# Patient Record
Sex: Male | Born: 1970
Health system: Southern US, Community
[De-identification: ages and names within clinical notes are randomized; demographics above are authoritative.]

## PROBLEM LIST (undated history)

## (undated) DIAGNOSIS — F419 Anxiety disorder, unspecified: Secondary | ICD-10-CM

## (undated) DIAGNOSIS — T7840XA Allergy, unspecified, initial encounter: Secondary | ICD-10-CM

## (undated) DIAGNOSIS — K219 Gastro-esophageal reflux disease without esophagitis: Secondary | ICD-10-CM

## (undated) DIAGNOSIS — R519 Headache, unspecified: Secondary | ICD-10-CM

## (undated) DIAGNOSIS — E119 Type 2 diabetes mellitus without complications: Secondary | ICD-10-CM

## (undated) DIAGNOSIS — E785 Hyperlipidemia, unspecified: Secondary | ICD-10-CM

## (undated) DIAGNOSIS — G4730 Sleep apnea, unspecified: Secondary | ICD-10-CM

## (undated) HISTORY — PX: TRIGGER FINGER RELEASE: SHX641

## (undated) HISTORY — PX: WISDOM TOOTH EXTRACTION: SHX21

## (undated) HISTORY — PX: OTHER SURGICAL HISTORY: SHX169

---

## 2017-10-17 ENCOUNTER — Ambulatory Visit
Admission: EM | Admit: 2017-10-17 | Discharge: 2017-10-17 | Disposition: A | Discharge status: Home / Self Care | Payer: 59 | Attending: Family Medicine | Admitting: Family Medicine

## 2017-10-17 ENCOUNTER — Other Ambulatory Visit: Payer: Self-pay

## 2017-10-17 ENCOUNTER — Encounter: Payer: Self-pay | Admitting: Emergency Medicine

## 2017-10-17 DIAGNOSIS — J0111 Acute recurrent frontal sinusitis: Secondary | ICD-10-CM

## 2017-10-17 HISTORY — DX: Anxiety disorder, unspecified: F41.9

## 2017-10-17 MED ORDER — AMOXICILLIN-POT CLAVULANATE 875-125 MG PO TABS: 1.0000 | ORAL_TABLET | Freq: Two times a day (BID) | ORAL | 0 refills | Status: DC

## 2017-10-17 NOTE — ED Provider Notes (Signed)
[facility]    CSN: [id number] Arrival date & time: 10/17/17  1211     History   Chief Complaint Chief Complaint  Patient presents with  . Facial Pain    pressure, congestion   HPI  46 year old male with a history of anxiety and sinusitis presents with the above complaints.  Patient states that he has been sick for the past week.  He has had severe sinus pressure and pain.  Associated congestion.  Symptoms are moderate to severe.  No fever.  He is taking over-the-counter ibuprofen and sinus remedies without improvement.  No known exacerbating relieving factors.  No other associated symptoms.  No other complaints or concerns at this time.  Past Medical History:  Diagnosis Date  . Anxiety    Past Surgical History:  Procedure Laterality Date  . lipoma removal Left    underarm  . TRIGGER FINGER RELEASE    . WISDOM TOOTH EXTRACTION      Home Medications    Prior to Admission medications   Medication Sig Start Date End Date Taking? Authorizing Provider  Venlafaxine HCl (EFFEXOR PO) Take 1 tablet daily at 3 pm by mouth.   Yes [provider]  amoxicillin-clavulanate (AUGMENTIN) 875-125 MG tablet Take 1 tablet every 12 (twelve) hours by mouth. 10/17/17   Tommie Samsook, Tex Conroy G, DO   Family History Family History  Problem Relation Age of Onset  . Diabetes Mother   . Asthma Mother   . Diabetes Father    Social History Social History   Tobacco Use  . Smoking status: Never Smoker  . Smokeless tobacco: Never Used  Substance Use Topics  . Alcohol use: Yes    Alcohol/week: 1.8 oz    Types: 3 Cans of beer per week  . Drug use: No   Allergies   Patient has no known allergies.  Review of Systems Review of Systems  Constitutional: Negative for fever.  HENT: Positive for congestion, sinus pressure and sinus pain.   All other systems reviewed and are negative.  Physical Exam Triage Vital Signs ED Triage Vitals  Enc Vitals Group     BP 10/17/17 1243 (!)  142/95     Pulse Rate 10/17/17 1243 66     Resp 10/17/17 1243 16     Temp 10/17/17 1243 98.7 F (37.1 C)     Temp Source 10/17/17 1243 Oral     SpO2 10/17/17 1243 99 %     Weight 10/17/17 1242 220 lb (99.8 kg)     Height 10/17/17 1242 5\' 7"  (1.702 m)     Head Circumference --      Peak Flow --      Pain Score 10/17/17 1242 6     Pain Loc --      Pain Edu? --      Excl. in GC? --    Updated Vital Signs BP (!) 142/95 (BP Location: Left Arm)   Pulse 66   Temp 98.7 F (37.1 C) (Oral)   Resp 16   Ht 5\' 7"  (1.702 m)   Wt 220 lb (99.8 kg)   SpO2 99%   BMI 34.46 kg/m  Physical Exam  Constitutional: He is oriented to person, place, and time. He appears well-developed. No distress.  HENT:  Head: Normocephalic and atraumatic.  Nose: Nose normal.  Mouth/Throat: Oropharynx is clear and moist.  Frontal sinus tenderness to palpation/percussion.  Eyes: Conjunctivae are normal. No scleral icterus.  Neck: Neck supple.  Cardiovascular: Normal  rate and regular rhythm.  Pulmonary/Chest: Effort normal and breath sounds normal. No respiratory distress. He has no wheezes. He has no rales.  Lymphadenopathy:    He has no cervical adenopathy.  Neurological: He is alert and oriented to person, place, and time.  Skin: Skin is warm. No rash noted.  Psychiatric: He has a normal mood and affect. His behavior is normal.  Vitals reviewed.  UC Treatments / Results  Labs (all labs ordered are listed, but only abnormal results are displayed) Labs Reviewed - No data to display  EKG  EKG Interpretation None       Radiology No results found.  Procedures Procedures (including critical care time)  Medications Ordered in UC Medications - No data to display   Initial Impression / Assessment and Plan / UC Course  I have reviewed the triage vital signs and the nursing notes.  Pertinent labs & imaging results that were available during my care of the patient were reviewed by me and considered  in my medical decision making (see chart for details).     46 year old male presents with acute sinusitis.  Treating with Augmentin.  Final Clinical Impressions(s) / UC Diagnoses   Final diagnoses:  Acute recurrent frontal sinusitis    ED Discharge Orders        Ordered    amoxicillin-clavulanate (AUGMENTIN) 875-125 MG tablet  Every 12 hours     10/17/17 1314     Controlled Substance Prescriptions [place] Controlled Substance Registry consulted? Not Applicable   Tommie SamsCook, Dijon Kohlman G, DO 10/17/17 1416

## 2017-10-17 NOTE — ED Triage Notes (Signed)
Patient in today c/o sinus congestion, pressure and pain x 1 week. Patient denies fever. Patient has tried OTC Ibuprofen/Advil.

## 2017-10-17 NOTE — Discharge Instructions (Signed)
Antibiotic as prescribed.  Take care  Dr. Myishia Kasik  

## 2018-01-16 ENCOUNTER — Other Ambulatory Visit: Payer: Self-pay

## 2018-01-16 ENCOUNTER — Ambulatory Visit
Admission: EM | Admit: 2018-01-16 | Discharge: 2018-01-16 | Disposition: A | Discharge status: Home / Self Care | Payer: 59 | Attending: Family Medicine | Admitting: Family Medicine

## 2018-01-16 DIAGNOSIS — M25572 Pain in left ankle and joints of left foot: Secondary | ICD-10-CM | POA: Diagnosis not present

## 2018-01-16 MED ORDER — MELOXICAM 15 MG PO TABS: 15.0000 mg | ORAL_TABLET | Freq: Every day | ORAL | 0 refills | Status: DC | PRN

## 2018-01-16 NOTE — ED Provider Notes (Signed)
[facility] ____________________________________________  Time seen: Approximately 1:00 PM  I have reviewed the triage vital signs and the nursing notes.   HISTORY  Chief Complaint Ankle Pain (left)   HPI Joe Richardson is a 47 y.o. male presenting for evaluation of left ankle pain that has been present since this past Saturday.  Patient reports 10 years ago he sustained an inversion injury which he intermittently has pain to left ankle since.  Patient states that he was sitting in his left ankle felt somewhat tight, so he began to roll his ankle in efforts to pop the ankle and relieve tightness.  Patient states that the ankle did pop and states he then had pain to the lateral ankle afterwards and states the pain has continued throughout the week.  Denies any fall or direct trauma.  States pain is primarily with activity.  Improves with rest and over-the-counter ibuprofen.  States while ibuprofen is active he is able to continue activities normally, but once ibuprofen wears off he then begins to limp again.  Denies paresthesias, swelling, skin changes.  Reports has continue to remain active.  Reports otherwise feels well.  Denies other aggravating or alleviating factors. Denies recent sickness. Denies recent antibiotic use.  Denies cardiac history.  Denies renal insufficiency.  Barbette Reichmann, MD: PCP   Past Medical History:  Diagnosis Date  . Anxiety     There are no active problems to display for this patient.   Past Surgical History:  Procedure Laterality Date  . lipoma removal Left    underarm  . TRIGGER FINGER RELEASE    . WISDOM TOOTH EXTRACTION       No current facility-administered medications for this encounter.   Current Outpatient Medications:  .  Venlafaxine HCl (EFFEXOR PO), Take 1 tablet daily at 3 pm by mouth., Disp: , Rfl:  .  meloxicam (MOBIC) 15 MG tablet, Take 1 tablet (15 mg total) by mouth daily as needed., Disp: 10 tablet, Rfl:  0  Allergies Patient has no known allergies.  Family History  Problem Relation Age of Onset  . Diabetes Mother   . Asthma Mother   . Diabetes Father     Social History Social History   Tobacco Use  . Smoking status: Never Smoker  . Smokeless tobacco: Never Used  Substance Use Topics  . Alcohol use: Yes    Alcohol/week: 1.8 oz    Types: 3 Cans of beer per week  . Drug use: No    Review of Systems Constitutional: No fever/chills Cardiovascular: Denies chest pain. Respiratory: Denies shortness of breath. Gastrointestinal: No abdominal pain.  Musculoskeletal: Negative for back pain.  As above Skin: Negative for rash.   ____________________________________________   PHYSICAL EXAM:  VITAL SIGNS: ED Triage Vitals  Enc Vitals Group     BP 01/16/18 1218 130/88     Pulse Rate 01/16/18 1218 85     Resp 01/16/18 1218 18     Temp 01/16/18 1218 98.5 F (36.9 C)     Temp Source 01/16/18 1218 Oral     SpO2 01/16/18 1218 97 %     Weight 01/16/18 1216 215 lb (97.5 kg)     Height 01/16/18 1216 5\' 8"  (1.727 m)     Head Circumference --      Peak Flow --      Pain Score 01/16/18 1216 9     Pain Loc --      Pain Edu? --      Excl.  in GC? --     Constitutional: Alert and oriented. Well appearing and in no acute distress. Cardiovascular: Normal rate, regular rhythm. Grossly normal heart sounds.  Good peripheral circulation. Respiratory: Normal respiratory effort without tachypnea nor retractions. Breath sounds are clear and equal bilaterally. No wheezes, rales, rhonchi. Musculoskeletal: Steady gait. Bilateral pedal pulses equal and easily palpated.      Right lower leg:  No tenderness or edema.      Left lower leg:  No tenderness or edema.  Except: left lateral ankle  Mild tenderness along ATFL, no point bony tenderness, full range of motion, pain with ankle rotation, no pain with resisted plantarflexion or dorsiflexion, steady gait, normal sensation.  Neurologic:  Normal  speech and language. Speech is normal. No gait instability.  Skin:  Skin is warm, dry and intact. No rash noted. Psychiatric: Mood and affect are normal. Speech and behavior are normal. Patient exhibits appropriate insight and judgment   ___________________________________________   LABS (all labs ordered are listed, but only abnormal results are displayed)  Labs Reviewed - No data to display   PROCEDURES Procedures   INITIAL IMPRESSION / ASSESSMENT AND PLAN / ED COURSE  Pertinent labs & imaging results that were available during my care of the patient were reviewed by me and considered in my medical decision making (see chart for details).  Well appearing. No acute distress. Left ankle pain, acute on chronic. No direct trauma. Suspect sprain injury.  Discussed in detail with patient has no direct trauma and no point bony tenderness, discussed use of x-ray, will defer x-ray at this time.  Patient agrees.  Encouraged rest, ice, supportive care, splint given and daily Mobic.  Encouraged orthopedic follow-up if pain continues.Discussed indication, risks and benefits of medications with patient.  Discussed follow up and return parameters including no resolution or any worsening concerns. Patient verbalized understanding and agreed to plan.   ____________________________________________   FINAL CLINICAL IMPRESSION(S) / ED DIAGNOSES  Final diagnoses:  Acute left ankle pain     ED Discharge Orders        Ordered    meloxicam (MOBIC) 15 MG tablet  Daily PRN     01/16/18 1259       Note: This dictation was prepared with Dragon dictation along with smaller phrase technology. Any transcriptional errors that result from this process are unintentional.         Renford DillsMiller, Zeppelin Commisso, NP 01/16/18 1335

## 2018-01-16 NOTE — ED Triage Notes (Signed)
Patient complains of left ankle pain. Patient states that last Saturday his ankle was aching and he moved it around, heard a pop and has been having pain since.

## 2018-01-16 NOTE — Discharge Instructions (Signed)
Take medication as prescribed. Rest. Drink plenty of fluids. Wear splint for support.   Follow up with orthopedic as needed for continued pain.   Follow up with your primary care physician this week as needed. Return to Urgent care for new or worsening concerns.

## 2018-01-19 ENCOUNTER — Telehealth: Payer: Self-pay

## 2018-01-19 NOTE — Telephone Encounter (Signed)
Called to follow up with patient since visit here at [facility]. Patient instructed to call back with any questions or concerns. MAH  

## 2018-04-07 ENCOUNTER — Other Ambulatory Visit: Payer: Self-pay | Admitting: Podiatry

## 2018-04-07 DIAGNOSIS — M25872 Other specified joint disorders, left ankle and foot: Secondary | ICD-10-CM

## 2018-04-14 ENCOUNTER — Ambulatory Visit
Admission: RE | Admit: 2018-04-14 | Discharge: 2018-04-14 | Disposition: A | Discharge status: Home / Self Care | Payer: 59 | Source: Ambulatory Visit | Attending: Podiatry | Admitting: Podiatry

## 2018-04-14 DIAGNOSIS — M25872 Other specified joint disorders, left ankle and foot: Secondary | ICD-10-CM | POA: Insufficient documentation

## 2018-04-14 DIAGNOSIS — R600 Localized edema: Secondary | ICD-10-CM | POA: Insufficient documentation

## 2018-07-24 ENCOUNTER — Ambulatory Visit
Admission: EM | Admit: 2018-07-24 | Discharge: 2018-07-24 | Disposition: A | Discharge status: Home / Self Care | Payer: 59 | Attending: Internal Medicine | Admitting: Internal Medicine

## 2018-07-24 ENCOUNTER — Other Ambulatory Visit: Payer: Self-pay

## 2018-07-24 ENCOUNTER — Ambulatory Visit (INDEPENDENT_AMBULATORY_CARE_PROVIDER_SITE_OTHER): Payer: 59

## 2018-07-24 DIAGNOSIS — K30 Functional dyspepsia: Secondary | ICD-10-CM | POA: Diagnosis not present

## 2018-07-24 DIAGNOSIS — R059 Cough, unspecified: Secondary | ICD-10-CM

## 2018-07-24 DIAGNOSIS — R05 Cough: Secondary | ICD-10-CM

## 2018-07-24 DIAGNOSIS — R1013 Epigastric pain: Secondary | ICD-10-CM

## 2018-07-24 DIAGNOSIS — H6691 Otitis media, unspecified, right ear: Secondary | ICD-10-CM | POA: Diagnosis not present

## 2018-07-24 MED ORDER — IPRATROPIUM-ALBUTEROL 0.5-2.5 (3) MG/3ML IN SOLN
3.0000 mL | Freq: Once | RESPIRATORY_TRACT | Status: AC
  Administered 2018-07-24: 3 mL via RESPIRATORY_TRACT

## 2018-07-24 MED ORDER — AMOXICILLIN-POT CLAVULANATE 875-125 MG PO TABS: 1.0000 | ORAL_TABLET | Freq: Two times a day (BID) | ORAL | 0 refills | Status: DC

## 2018-07-24 MED ORDER — PREDNISONE 50 MG PO TABS: 50.0000 mg | ORAL_TABLET | Freq: Every day | ORAL | 0 refills | Status: DC

## 2018-07-24 MED ORDER — ALBUTEROL SULFATE HFA 108 (90 BASE) MCG/ACT IN AERS: 1.0000 | INHALATION_SPRAY | RESPIRATORY_TRACT | 0 refills | Status: DC | PRN

## 2018-07-24 NOTE — ED Provider Notes (Signed)
[facility]    CSN: [id number] Arrival date & time: 07/24/18  [id number]     History   Chief Complaint Chief Complaint  Patient presents with  . Cough    HPI Joe Richardson is a 47 y.o. male.   He has past history of hyperlipidemia and diabetes.  He presents today with 1 month history of dry cough, this is intermittent during the day.  He does not have any prior history of this symptom.  On a couple occasions when he sneezed, he feels like his throat closed off, this was brief, several seconds, but frightening. No fever, cough is not productive.  Does have some runny nose.   Gives history of environmental allergies, not taking any medicines for this currently. History of wheezing/asthma, has not been on a maintenance medicine but has used an albuterol inhaler intermittently over the last 10 to 15 years.  Has not been hospitalized for respiratory symptoms such as wheezing. Has some difficulty with heartburn, takes 20 mg of Pepcid daily.  HPI  Past Medical History:  Diagnosis Date  . Anxiety     Past Surgical History:  Procedure Laterality Date  . lipoma removal Left    underarm  . TRIGGER FINGER RELEASE    . WISDOM TOOTH EXTRACTION         Home Medications    Prior to Admission medications   Medication Sig Start Date End Date Taking? Authorizing Provider  meloxicam (MOBIC) 15 MG tablet Take 1 tablet (15 mg total) by mouth daily as needed. 01/16/18  Yes Renford DillsMiller, Lindsey, NP  metFORMIN (GLUCOPHAGE-XR) 500 MG 24 hr tablet  05/07/18  Yes [provider]  Venlafaxine HCl (EFFEXOR PO) Take 1 tablet daily at 3 pm by mouth.   Yes [provider]  albuterol (PROVENTIL HFA;VENTOLIN HFA) 108 (90 Base) MCG/ACT inhaler Inhale 1-2 puffs into the lungs every 4 (four) hours as needed for wheezing or shortness of breath. 07/24/18   Isa RankinMurray, Terrance Usery Wilson, MD  amoxicillin-clavulanate (AUGMENTIN) 875-125 MG tablet Take 1 tablet by mouth every 12 (twelve) hours. 07/24/18    Isa RankinMurray, Jadan Rouillard Wilson, MD  atorvastatin (LIPITOR) 40 MG tablet  05/07/18   [provider]  predniSONE (DELTASONE) 50 MG tablet Take 1 tablet (50 mg total) by mouth daily. 07/24/18   Isa RankinMurray, Sylvie Mifsud Wilson, MD    Family History Family History  Problem Relation Age of Onset  . Diabetes Mother   . Asthma Mother   . Diabetes Father     Social History Social History   Tobacco Use  . Smoking status: Never Smoker  . Smokeless tobacco: Never Used  Substance Use Topics  . Alcohol use: Yes    Alcohol/week: 3.0 standard drinks    Types: 3 Cans of beer per week    Comment: occasionally  . Drug use: No     Allergies   Patient has no known allergies.   Review of Systems Review of Systems  All other systems reviewed and are negative.    Physical Exam Triage Vital Signs ED Triage Vitals  Enc Vitals Group     BP 07/24/18 1004 (!) 129/91     Pulse Rate 07/24/18 1004 66     Resp 07/24/18 1004 18     Temp 07/24/18 1004 98.5 F (36.9 C)     Temp Source 07/24/18 1004 Oral     SpO2 07/24/18 1004 95 %     Weight 07/24/18 1003 220 lb (99.8 kg)     Height  07/24/18 1003 5\' 7"  (1.702 m)     Pain Score 07/24/18 1003 0     Pain Loc --    Updated Vital Signs BP (!) 129/91 (BP Location: Left Arm)   Pulse 66   Temp 98.5 F (36.9 C) (Oral)   Resp 18   Ht 5\' 7"  (1.702 m)   Wt 99.8 kg   SpO2 95%   BMI 34.46 kg/m  Physical Exam  Constitutional: He is oriented to person, place, and time. No distress.  Alert, nicely groomed  HENT:  Head: Atraumatic.  Bilateral TMs are dull, right TM is quite red Mild nasal congestion bilaterally Posterior pharynx is difficult to visualize  Eyes:  Conjugate gaze, no eye redness/drainage  Neck: Neck supple.  Cardiovascular: Normal rate and regular rhythm.  Possible S4 appreciated  Pulmonary/Chest: No respiratory distress. He has no wheezes. He has no rales.  Coarse but symmetric breath sounds throughout, good air excursion  Abdominal:  Soft. He exhibits no distension. There is no rebound and no guarding.  Protuberant Mild to moderate epigastric tenderness to deep palpation  Musculoskeletal: Normal range of motion.  1+ and symmetric pitting edema in the bilateral lower extremities just above the ankle, patient states this is chronic and stable  Neurological: He is alert and oriented to person, place, and time.  Skin: Skin is warm and dry.  No cyanosis  Nursing note and vitals reviewed.    UC Treatments / Results   Radiology Dg Chest 2 View  Result Date: 07/24/2018 CLINICAL DATA:  Cough and short of breath EXAM: CHEST - 2 VIEW COMPARISON:  None. FINDINGS: The heart size and mediastinal contours are within normal limits. Both lungs are clear. The visualized skeletal structures are unremarkable. IMPRESSION: No active cardiopulmonary disease. Electronically Signed   By: Marlan Palauharles  Clark M.D.   On: 07/24/2018 10:36    Procedures Procedures (including critical care time)  Medications Ordered in UC Medications  ipratropium-albuterol (DUONEB) 0.5-2.5 (3) MG/3ML nebulizer solution 3 mL (3 mLs Nebulization Given 07/24/18 1057)   Feels less chest tightness after duoneb treatment   Final Clinical Impressions(s) / UC Diagnoses   Final diagnoses:  Cough  Acute right otitis media  Dyspepsia     Discharge Instructions     Chest x-ray today was normal, and did not give a reason for your cough.  Physical exam demonstrated a right ear infection.,  Common possible causes of persistent dry cough include sinus conditions/postnasal drip, asthma/reactive airways disorder, and airway irritation from stomach acid.  Prescription for an antibiotic (amoxicillin/clavulanate), an albuterol inhaler, and some prednisone were sent to the pharmacy.  Please follow-up with your primary care provider if symptoms are not improving in a week or 2, to discuss further work-up that may be helpful.    ED Prescriptions    Medication Sig Dispense  Auth. Provider   albuterol (PROVENTIL HFA;VENTOLIN HFA) 108 (90 Base) MCG/ACT inhaler Inhale 1-2 puffs into the lungs every 4 (four) hours as needed for wheezing or shortness of breath. 1 Inhaler Isa RankinMurray, Lianna Sitzmann Wilson, MD   amoxicillin-clavulanate (AUGMENTIN) 875-125 MG tablet Take 1 tablet by mouth every 12 (twelve) hours. 14 tablet Isa RankinMurray, Rodrickus Min Wilson, MD   predniSONE (DELTASONE) 50 MG tablet Take 1 tablet (50 mg total) by mouth daily. 3 tablet Isa RankinMurray, Shriyan Arakawa Wilson, MD        Isa RankinMurray, Flavio Lindroth Wilson, MD 07/31/18 [phone]

## 2018-07-24 NOTE — Discharge Instructions (Addendum)
Chest x-ray today was normal, and did not give a reason for your cough.  Physical exam demonstrated a right ear infection.,  Common possible causes of persistent dry cough include sinus conditions/postnasal drip, asthma/reactive airways disorder, and airway irritation from stomach acid.  Prescription for an antibiotic (amoxicillin/clavulanate), an albuterol inhaler, and some prednisone were sent to the pharmacy.  Please follow-up with your primary care provider if symptoms are not improving in a week or 2, to discuss further work-up that may be helpful.

## 2018-07-24 NOTE — ED Triage Notes (Signed)
Patient complains of dry cough x 1 month and states that at times after sneezing he has felt almost as if his throat closes off. Patient denies that symptoms currently.

## 2018-08-31 ENCOUNTER — Ambulatory Visit
Admission: EM | Admit: 2018-08-31 | Discharge: 2018-08-31 | Disposition: A | Discharge status: Home / Self Care | Payer: 59 | Attending: Physician Assistant | Admitting: Physician Assistant

## 2018-08-31 ENCOUNTER — Other Ambulatory Visit: Payer: Self-pay

## 2018-08-31 DIAGNOSIS — J019 Acute sinusitis, unspecified: Secondary | ICD-10-CM | POA: Diagnosis not present

## 2018-08-31 DIAGNOSIS — R05 Cough: Secondary | ICD-10-CM | POA: Diagnosis not present

## 2018-08-31 DIAGNOSIS — J309 Allergic rhinitis, unspecified: Secondary | ICD-10-CM | POA: Diagnosis not present

## 2018-08-31 DIAGNOSIS — R059 Cough, unspecified: Secondary | ICD-10-CM

## 2018-08-31 MED ORDER — PREDNISONE 20 MG PO TABS: ORAL_TABLET | ORAL | 0 refills | Status: DC

## 2018-08-31 MED ORDER — DOXYCYCLINE HYCLATE 100 MG PO CAPS: 100.0000 mg | ORAL_CAPSULE | Freq: Two times a day (BID) | ORAL | 0 refills | Status: AC

## 2018-08-31 MED ORDER — BENZONATATE 100 MG PO CAPS: 100.0000 mg | ORAL_CAPSULE | Freq: Three times a day (TID) | ORAL | 0 refills | Status: AC

## 2018-08-31 NOTE — Discharge Instructions (Signed)
SINUSITIS: Your condition today could be related to persistent bacterial sinus infection or allergies. Will cover you for both possibilities. Begin doxycycline and prednisone. Reviewed use of a nasal saline irrigation system. May consider use of intranasal steroids, such as Flonase as well. Use medications as directed. If antibiotics are prescribed, take the full course of antibiotics. Also begin Claritin D. Increase rest, fluids. If you do not improve or if you worsen after a course of antibiotics, you should be re-examined. You may need a different antibiotic or further evaluation with imaging or an exam of the inside of the sinuses   COUGH/ALLERGIES: Your lungs are clear today. The cough is likely related to post nasal drainage. Begin Claritin D and Flonase. Take tessalon perles as needed for cough. Continue using albuterol as needed.  Make a follow up appointment with your PCP in the next 5-10 days

## 2018-08-31 NOTE — ED Provider Notes (Signed)
[facility]    CSN: [id number] Arrival date & time: 08/31/18  1120     History   Chief Complaint Chief Complaint  Patient presents with  . Cough    APPT    HPI Joe Richardson is a 47 y.o. male. Patient presents today for congestion, bilateral ear pain, sinus pain, and cough. He states that he was seen 1 month ago for the same symptoms which went away with Augmentin. He says the symptoms returned about a week ago and have been steadily worsening. Patient does admit to history of mild asthma but denies any wheezing or SOB. He denies fevers, but admits to fatigue. He has tried OTC cough/cold medications recently without improvement in symptoms. He has no other complaints/concerns today.  HPI  Past Medical History:  Diagnosis Date  . Anxiety     There are no active problems to display for this patient.   Past Surgical History:  Procedure Laterality Date  . lipoma removal Left    underarm  . TRIGGER FINGER RELEASE    . WISDOM TOOTH EXTRACTION         Home Medications    Prior to Admission medications   Medication Sig Start Date End Date Taking? Authorizing Provider  albuterol (PROVENTIL HFA;VENTOLIN HFA) 108 (90 Base) MCG/ACT inhaler Inhale 1-2 puffs into the lungs every 4 (four) hours as needed for wheezing or shortness of breath. 07/24/18  Yes Isa Rankin, MD  atorvastatin (LIPITOR) 40 MG tablet  05/07/18  Yes [provider]  meloxicam (MOBIC) 15 MG tablet Take 1 tablet (15 mg total) by mouth daily as needed. 01/16/18  Yes Renford Dills, NP  metFORMIN (GLUCOPHAGE-XR) 500 MG 24 hr tablet  05/07/18  Yes [provider]  Venlafaxine HCl (EFFEXOR PO) Take 1 tablet daily at 3 pm by mouth.   Yes [provider]  amoxicillin-clavulanate (AUGMENTIN) 875-125 MG tablet Take 1 tablet by mouth every 12 (twelve) hours. 07/24/18   Isa Rankin, MD  benzonatate (TESSALON) 100 MG capsule Take 1 capsule (100 mg total) by mouth every 8  (eight) hours for 10 days. 08/31/18 09/10/18  Eusebio Friendly B, PA-C  doxycycline (VIBRAMYCIN) 100 MG capsule Take 1 capsule (100 mg total) by mouth 2 (two) times daily for 10 days. 08/31/18 09/10/18  Eusebio Friendly B, PA-C  predniSONE (DELTASONE) 20 MG tablet Take 2 tabs PO x 5 days 08/31/18   Shirlee Latch, PA-C    Family History Family History  Problem Relation Age of Onset  . Diabetes Mother   . Asthma Mother   . Diabetes Father     Social History Social History   Tobacco Use  . Smoking status: Never Smoker  . Smokeless tobacco: Never Used  Substance Use Topics  . Alcohol use: Yes    Alcohol/week: 3.0 standard drinks    Types: 3 Cans of beer per week    Comment: occasionally  . Drug use: No     Allergies   Patient has no known allergies.   Review of Systems Review of Systems  Constitutional: Positive for fatigue. Negative for chills and fever.  HENT: Positive for congestion, ear pain, postnasal drip, rhinorrhea, sinus pressure and sinus pain. Negative for ear discharge and sore throat.   Eyes: Negative for discharge and redness.  Respiratory: Positive for cough. Negative for chest tightness, shortness of breath and wheezing.   Cardiovascular: Negative for chest pain.  Gastrointestinal: Negative for abdominal pain, nausea and vomiting.  Musculoskeletal: Negative for  arthralgias and myalgias.  Skin: Negative for color change and rash.  Allergic/Immunologic: Positive for environmental allergies.  Neurological: Positive for headaches. Negative for dizziness and weakness.  Hematological: Negative for adenopathy.     Physical Exam Triage Vital Signs ED Triage Vitals  Enc Vitals Group     BP 08/31/18 1142 (!) 133/92     Pulse Rate 08/31/18 1142 77     Resp 08/31/18 1142 18     Temp 08/31/18 1142 98.5 F (36.9 C)     Temp Source 08/31/18 1142 Oral     SpO2 08/31/18 1142 98 %     Weight 08/31/18 1140 220 lb (99.8 kg)     Height 08/31/18 1140 5\' 7"  (1.702 m)     Head  Circumference --      Peak Flow --      Pain Score 08/31/18 1140 4     Pain Loc --      Pain Edu? --      Excl. in GC? --    No data found.  Updated Vital Signs BP (!) 133/92 (BP Location: Left Arm)   Pulse 77   Temp 98.5 F (36.9 C) (Oral)   Resp 18   Ht 5\' 7"  (1.702 m)   Wt 220 lb (99.8 kg)   SpO2 98%   BMI 34.46 kg/m   Visual Acuity Right Eye Distance:   Left Eye Distance:   Bilateral Distance:    Right Eye Near:   Left Eye Near:    Bilateral Near:     Physical Exam  Constitutional: He is oriented to person, place, and time. He appears well-developed and well-nourished. No distress.  HENT:  Head: Normocephalic and atraumatic.  Right Ear: Tympanic membrane, external ear and ear canal normal.  Left Ear: Tympanic membrane, external ear and ear canal normal.  Nose: Rhinorrhea (moderate purlent drainage bilat nares) present. No septal deviation. Right sinus exhibits maxillary sinus tenderness. Right sinus exhibits no frontal sinus tenderness. Left sinus exhibits maxillary sinus tenderness. Left sinus exhibits no frontal sinus tenderness.  Mouth/Throat: Oropharynx is clear and moist. No oropharyngeal exudate.  Eyes: Pupils are equal, round, and reactive to light. Conjunctivae and EOM are normal. Right eye exhibits no discharge. Left eye exhibits no discharge. No scleral icterus.  Cardiovascular: Normal rate and regular rhythm.  Murmur heard. Pulmonary/Chest: Effort normal and breath sounds normal. He has no wheezes.  Lymphadenopathy:    He has no cervical adenopathy.  Neurological: He is alert and oriented to person, place, and time.  Skin: Skin is warm and dry. No rash noted.  Psychiatric: He has a normal mood and affect. His behavior is normal.  Nursing note and vitals reviewed.    UC Treatments / Results  Labs (all labs ordered are listed, but only abnormal results are displayed) Labs Reviewed - No data to display  EKG None  Radiology No results  found.  Procedures Procedures (including critical care time)  Medications Ordered in UC Medications - No data to display  Initial Impression / Assessment and Plan / UC Course  I have reviewed the triage vital signs and the nursing notes.  Pertinent labs & imaging results that were available during my care of the patient were reviewed by me and considered in my medical decision making (see chart for details).    Final Clinical Impressions(s) / UC Diagnoses   Final diagnoses:  Acute sinusitis treated with antibiotics in the past 60 days  Allergic rhinitis, unspecified seasonality, unspecified trigger  Cough     Discharge Instructions     SINUSITIS: Your condition today could be related to persistent bacterial sinus infection or allergies. Will cover you for both possibilities. Begin doxycycline and prednisone. Reviewed use of a nasal saline irrigation system. May consider use of intranasal steroids, such as Flonase as well. Use medications as directed. If antibiotics are prescribed, take the full course of antibiotics. Also begin Claritin D. Increase rest, fluids. If you do not improve or if you worsen after a course of antibiotics, you should be re-examined. You may need a different antibiotic or further evaluation with imaging or an exam of the inside of the sinuses   COUGH/ALLERGIES: Your lungs are clear today. The cough is likely related to post nasal drainage. Begin Claritin D and Flonase. Take tessalon perles as needed for cough. Continue using albuterol as needed.  Make a follow up appointment with your PCP in the next 5-10 days    ED Prescriptions    Medication Sig Dispense Auth. Provider   doxycycline (VIBRAMYCIN) 100 MG capsule Take 1 capsule (100 mg total) by mouth 2 (two) times daily for 10 days. 20 capsule Eusebio Friendly B, PA-C   predniSONE (DELTASONE) 20 MG tablet Take 2 tabs PO x 5 days 10 tablet Eusebio Friendly B, PA-C   benzonatate (TESSALON) 100 MG capsule Take 1  capsule (100 mg total) by mouth every 8 (eight) hours for 10 days. 30 capsule Shirlee Latch, PA-C     Controlled Substance Prescriptions [place] Controlled Substance Registry consulted? Not Applicable   Gareth Morgan 09/02/18 Rickey Primus

## 2018-08-31 NOTE — ED Triage Notes (Signed)
Patient complains of cough and congestion, ear pain. Patient states that he was seen for this on 08/16. Treated with Augmentin, prednisone and albuterol. Reports that he felt better for a few days but has been worsening recently.

## 2019-01-25 ENCOUNTER — Encounter: Payer: Self-pay | Admitting: Emergency Medicine

## 2019-01-25 ENCOUNTER — Other Ambulatory Visit: Payer: Self-pay

## 2019-01-25 ENCOUNTER — Ambulatory Visit
Admission: EM | Admit: 2019-01-25 | Discharge: 2019-01-25 | Disposition: A | Discharge status: Home / Self Care | Payer: 59 | Attending: Family Medicine | Admitting: Family Medicine

## 2019-01-25 DIAGNOSIS — J111 Influenza due to unidentified influenza virus with other respiratory manifestations: Secondary | ICD-10-CM

## 2019-01-25 DIAGNOSIS — R509 Fever, unspecified: Secondary | ICD-10-CM

## 2019-01-25 DIAGNOSIS — R69 Illness, unspecified: Principal | ICD-10-CM

## 2019-01-25 DIAGNOSIS — R51 Headache: Secondary | ICD-10-CM

## 2019-01-25 DIAGNOSIS — R52 Pain, unspecified: Secondary | ICD-10-CM | POA: Diagnosis not present

## 2019-01-25 HISTORY — DX: Hyperlipidemia, unspecified: E78.5

## 2019-01-25 HISTORY — DX: Type 2 diabetes mellitus without complications: E11.9

## 2019-01-25 MED ORDER — OSELTAMIVIR PHOSPHATE 75 MG PO CAPS: 75.0000 mg | ORAL_CAPSULE | Freq: Two times a day (BID) | ORAL | 0 refills | Status: DC

## 2019-01-25 NOTE — ED Triage Notes (Signed)
Patient in today c/o body aches, headache and fever (100.4) x 2 days. Patient has tried OTC Ibuprofen. Last dose at ~4am this morning.

## 2019-01-25 NOTE — ED Provider Notes (Addendum)
[facility] ____________________________________________  Time seen: Approximately 12:27 PM  I have reviewed the triage vital signs and the nursing notes.   HISTORY  Chief Complaint Generalized Body Aches; Headache; and Fever  HPI Joe Richardson is a 48 y.o. male presenting for evaluation of 2 days of chills, body aches, fevers.  States has had some sinus congestion and sinus pressure.  Denies sore throat.  Has continued to eat and drink overall well. Decreased appetite. Diffuse generalized body aches. Patient states his son was recently sick with similar complaints just prior to his sickness onset.  States his temperature was reading over 100 when he was feeling more achy.  States he does not feel like he had a fever now, but does still have some generalized body aches and feels tired.  Denies cough.  Denies chest pain or shortness of breath.  Denies abdominal pain, rash, dysuria, sore throat, recent sickness or other complaints.  Reports otherwise feels well.  Has taken some over-the-counter Tylenol and ibuprofen, last took ibuprofen in the middle the night last night.  Denies other relieving factors.  Barbette Reichmann, MD: PCP   Past Medical History:  Diagnosis Date  . Anxiety   . Diabetes mellitus without complication (HCC)    pre-diabetes  . Hyperlipidemia     There are no active problems to display for this patient.   Past Surgical History:  Procedure Laterality Date  . lipoma removal Left    underarm  . TRIGGER FINGER RELEASE    . WISDOM TOOTH EXTRACTION       No current facility-administered medications for this encounter.   Current Outpatient Medications:  .  albuterol (PROVENTIL HFA;VENTOLIN HFA) 108 (90 Base) MCG/ACT inhaler, Inhale 1-2 puffs into the lungs every 4 (four) hours as needed for wheezing or shortness of breath., Disp: 1 Inhaler, Rfl: 0 .  atorvastatin (LIPITOR) 40 MG tablet, , Disp: , Rfl:  .  metFORMIN (GLUCOPHAGE-XR) 500 MG 24 hr tablet,  , Disp: , Rfl:  .  venlafaxine XR (EFFEXOR-XR) 150 MG 24 hr capsule, Take 1 capsule by mouth daily., Disp: , Rfl:  .  oseltamivir (TAMIFLU) 75 MG capsule, Take 1 capsule (75 mg total) by mouth every 12 (twelve) hours., Disp: 10 capsule, Rfl: 0  Allergies Patient has no known allergies.  Family History  Problem Relation Age of Onset  . Diabetes Mother   . Asthma Mother   . Diabetes Father     Social History Social History   Tobacco Use  . Smoking status: Never Smoker  . Smokeless tobacco: Never Used  Substance Use Topics  . Alcohol use: Yes    Alcohol/week: 3.0 standard drinks    Types: 3 Cans of beer per week    Comment: occasionally  . Drug use: No    Review of Systems Constitutional: Positive fever.  Eyes: No visual changes. ENT: No sore throat. Cardiovascular: Denies chest pain. Respiratory: Denies shortness of breath. Gastrointestinal: No abdominal pain.  No nausea, no vomiting.  No diarrhea.   Genitourinary: Negative for dysuria.  Musculoskeletal: Negative for back pain. Skin: Negative for rash.  ____________________________________________   PHYSICAL EXAM:  VITAL SIGNS: ED Triage Vitals  Enc Vitals Group     BP 01/25/19 1122 (!) 125/91     Pulse Rate 01/25/19 1122 85     Resp 01/25/19 1122 16     Temp 01/25/19 1122 98.5 F (36.9 C)     Temp Source 01/25/19 1122 Oral     SpO2  01/25/19 1122 98 %     Weight 01/25/19 1123 210 lb (95.3 kg)     Height 01/25/19 1123 5\' 7"  (1.702 m)     Head Circumference --      Peak Flow --      Pain Score 01/25/19 1122 4     Pain Loc --      Pain Edu? --      Excl. in GC? --     Constitutional: Alert and oriented. Well appearing and in no acute distress. Eyes: Conjunctivae are normal. Head: Atraumatic.Mild tenderness to palpation bilateral frontal and maxillary sinuses. No swelling. No erythema.   Ears: no erythema, normal TMs bilaterally.   Nose: no nasal congestion   Mouth/Throat: Mucous membranes are moist.  Oropharynx non-erythematous. No tonsillar swelling or exudate.  Neck: No stridor.  No cervical spine tenderness to palpation. Hematological/Lymphatic/Immunilogical: No cervical lymphadenopathy. Cardiovascular: Normal rate, regular rhythm. Grossly normal heart sounds.  Good peripheral circulation. Respiratory: Normal respiratory effort.  No retractions. No wheezes, rales or rhonchi. Good air movement.  Musculoskeletal: No cervical, thoracic or lumbar tenderness to palpation.  Neurologic:  Normal speech and language. No gross focal neurologic deficits are appreciated. No gait instability. No meningismus.  Skin:  Skin is warm, dry and intact. No rash noted. Psychiatric: Mood and affect are normal. Speech and behavior are normal.  ___________________________________________   LABS (all labs ordered are listed, but only abnormal results are displayed)  Labs Reviewed - No data to display ____________________________________________  PROCEDURES Procedures    INITIAL IMPRESSION / ASSESSMENT AND PLAN / ED COURSE  Pertinent labs & imaging results that were available during my care of the patient were reviewed by me and considered in my medical decision making (see chart for details).  Well appearing patient. No acute distress. In absence of other symptoms, suspect influenza like illness. Discussed treatment options, will treat with oral tamiflu. Encourage rest, fluids, otc tylenol and ibuprofen, supportive care. Work note given. Discussed indication, risks and benefits of medications with patient.  Discussed follow up with Primary care physician this week. Discussed follow up and return parameters including no resolution or any worsening concerns. Patient verbalized understanding and agreed to plan.   ____________________________________________   FINAL CLINICAL IMPRESSION(S) / ED DIAGNOSES  Final diagnoses:  Influenza-like illness     ED Discharge Orders         Ordered     oseltamivir (TAMIFLU) 75 MG capsule  Every 12 hours     01/25/19 1221           Note: This dictation was prepared with Dragon dictation along with smaller phrase technology. Any transcriptional errors that result from this process are unintentional.         Renford Dills, NP 01/25/19 1232

## 2019-01-25 NOTE — Discharge Instructions (Signed)
Take medication as prescribed. Rest. Drink plenty of fluids.  ° °Follow up with your primary care physician this week as needed. Return to Urgent care for new or worsening concerns.  ° °

## 2019-05-27 ENCOUNTER — Ambulatory Visit
Admission: EM | Admit: 2019-05-27 | Discharge: 2019-05-27 | Disposition: A | Discharge status: Home / Self Care | Payer: 59 | Attending: Family Medicine | Admitting: Family Medicine

## 2019-05-27 ENCOUNTER — Other Ambulatory Visit: Payer: Self-pay

## 2019-05-27 ENCOUNTER — Encounter: Payer: Self-pay | Admitting: Emergency Medicine

## 2019-05-27 DIAGNOSIS — J0191 Acute recurrent sinusitis, unspecified: Secondary | ICD-10-CM

## 2019-05-27 MED ORDER — AMOXICILLIN-POT CLAVULANATE 875-125 MG PO TABS: 1.0000 | ORAL_TABLET | Freq: Two times a day (BID) | ORAL | 0 refills | Status: DC

## 2019-05-27 MED ORDER — PREDNISONE 50 MG PO TABS: ORAL_TABLET | ORAL | 0 refills | Status: DC

## 2019-05-27 NOTE — ED Triage Notes (Signed)
Pt c/o sinus pain, pressure, headache, and sinus congestion. Started about a week ago. Denies fever.

## 2019-05-27 NOTE — ED Provider Notes (Signed)
[facility]    CSN: [id number] Arrival date & time: 05/27/19  4742   History   Chief Complaint Chief Complaint  Patient presents with  . Sinus Problem    APPT   HPI  48 year old male presents the above complaint.  Patient reports a one-week history of sinus pain, pressure, congestion.  He states that his symptoms are severe.  He has had no relief with over-the-counter ibuprofen, Claritin, Sudafed.  Has a history of sinusitis.  Pain is currently 4/10 in severity.  No reports of purulent nasal discharge.  He does report associated ear discomfort.  No known exacerbating factors.  No other reported symptoms.  No other complaints or concerns at this time.  PMH, Surgical Hx, Family Hx, Social History reviewed and updated as below.  Past Medical History:  Diagnosis Date  . Anxiety   . Diabetes mellitus without complication (HCC)    pre-diabetes  . Hyperlipidemia   Hx of sinusitis Obesity  Past Surgical History:  Procedure Laterality Date  . lipoma removal Left    underarm  . TRIGGER FINGER RELEASE    . WISDOM TOOTH EXTRACTION       Home Medications    Prior to Admission medications   Medication Sig Start Date End Date Taking? Authorizing Provider  albuterol (PROVENTIL HFA;VENTOLIN HFA) 108 (90 Base) MCG/ACT inhaler Inhale 1-2 puffs into the lungs every 4 (four) hours as needed for wheezing or shortness of breath. 07/24/18  Yes Wynona Luna, MD  metFORMIN (GLUCOPHAGE-XR) 500 MG 24 hr tablet  05/07/18  Yes [provider]  venlafaxine XR (EFFEXOR-XR) 150 MG 24 hr capsule Take 1 capsule by mouth daily. 03/17/17  Yes [provider]  amoxicillin-clavulanate (AUGMENTIN) 875-125 MG tablet Take 1 tablet by mouth every 12 (twelve) hours. 05/27/19   Coral Spikes, DO  predniSONE (DELTASONE) 50 MG tablet 1 tablet daily x 5 days 05/27/19   Coral Spikes, DO  atorvastatin (LIPITOR) 40 MG tablet  05/07/18 05/27/19  [provider]    Family  History Family History  Problem Relation Age of Onset  . Diabetes Mother   . Asthma Mother   . Diabetes Father     Social History Social History   Tobacco Use  . Smoking status: Never Smoker  . Smokeless tobacco: Never Used  Substance Use Topics  . Alcohol use: Yes    Alcohol/week: 3.0 standard drinks    Types: 3 Cans of beer per week    Comment: occasionally  . Drug use: No     Allergies   Patient has no known allergies.   Review of Systems Review of Systems  Constitutional: Negative for fever.  HENT: Positive for congestion, ear pain, sinus pressure and sinus pain.    Physical Exam Triage Vital Signs ED Triage Vitals  Enc Vitals Group     BP 05/27/19 0933 (!) 151/100     Pulse Rate 05/27/19 0933 62     Resp 05/27/19 0933 18     Temp 05/27/19 0933 98 F (36.7 C)     Temp Source 05/27/19 0933 Oral     SpO2 05/27/19 0933 97 %     Weight 05/27/19 0930 220 lb (99.8 kg)     Height 05/27/19 0930 5\' 7"  (1.702 m)     Head Circumference --      Peak Flow --      Pain Score 05/27/19 0930 4     Pain Loc --  Pain Edu? --      Excl. in GC? --    Updated Vital Signs BP (!) 151/100 (BP Location: Right Arm)   Pulse 62   Temp 98 F (36.7 C) (Oral)   Resp 18   Ht 5\' 7"  (1.702 m)   Wt 99.8 kg   SpO2 97%   BMI 34.46 kg/m   Visual Acuity Right Eye Distance:   Left Eye Distance:   Bilateral Distance:    Right Eye Near:   Left Eye Near:    Bilateral Near:     Physical Exam Vitals signs and nursing note reviewed.  Constitutional:      General: He is not in acute distress.    Appearance: He is obese. He is not ill-appearing.  HENT:     Head: Normocephalic and atraumatic.     Right Ear: Tympanic membrane normal.     Left Ear: Tympanic membrane normal.     Nose: Congestion present.     Comments: Maxillary and frontal sinus tenderness to palpation. Eyes:     General:        Right eye: No discharge.        Left eye: No discharge.     Conjunctiva/sclera:  Conjunctivae normal.  Cardiovascular:     Rate and Rhythm: Normal rate and regular rhythm.  Pulmonary:     Effort: Pulmonary effort is normal.     Breath sounds: Normal breath sounds.  Neurological:     Mental Status: He is alert.  Psychiatric:        Mood and Affect: Mood normal.        Behavior: Behavior normal.    UC Treatments / Results  Labs (all labs ordered are listed, but only abnormal results are displayed) Labs Reviewed - No data to display  EKG None  Radiology No results found.  Procedures Procedures (including critical care time)  Medications Ordered in UC Medications - No data to display  Initial Impression / Assessment and Plan / UC Course  I have reviewed the triage vital signs and the nursing notes.  Pertinent labs & imaging results that were available during my care of the patient were reviewed by me and considered in my medical decision making (see chart for details).    48 year old male presents with sinusitis.  Treating with Augmentin and prednisone.  Final Clinical Impressions(s) / UC Diagnoses   Final diagnoses:  Acute recurrent sinusitis, unspecified location   Discharge Instructions   None    ED Prescriptions    Medication Sig Dispense Auth. Provider   amoxicillin-clavulanate (AUGMENTIN) 875-125 MG tablet Take 1 tablet by mouth every 12 (twelve) hours. 14 tablet Hadessah Grennan G, DO   predniSONE (DELTASONE) 50 MG tablet 1 tablet daily x 5 days 5 tablet Tommie Samsook, Elam Ellis G, DO     Controlled Substance Prescriptions [place] Controlled Substance Registry consulted? Not Applicable   Tommie SamsCook, Latravia Southgate G, DO 05/27/19 1031

## 2019-06-03 ENCOUNTER — Other Ambulatory Visit: Payer: Self-pay | Admitting: Emergency Medicine

## 2019-06-03 MED ORDER — AMOXICILLIN-POT CLAVULANATE ER 1000-62.5 MG PO TB12: 2.0000 | ORAL_TABLET | Freq: Two times a day (BID) | ORAL | 0 refills | Status: DC

## 2019-06-03 NOTE — Telephone Encounter (Signed)
Patient called reporting he finished his Prednisone and Amoxicillin for his sinus infection. He states he is still having pain and pressure between his eyes and is requesting another antibiotic. Spoke with Honor Loh, NP and a new script for Augmentin 2000/125mg  BID x 10 days to [facility] in [place]. Patient contacted via phone and made aware.

## 2020-01-11 ENCOUNTER — Other Ambulatory Visit: Payer: Self-pay

## 2020-01-11 ENCOUNTER — Ambulatory Visit
Admission: EM | Admit: 2020-01-11 | Discharge: 2020-01-11 | Disposition: A | Discharge status: Home / Self Care | Payer: 59 | Attending: Emergency Medicine | Admitting: Emergency Medicine

## 2020-01-11 ENCOUNTER — Encounter: Payer: Self-pay | Admitting: Emergency Medicine

## 2020-01-11 ENCOUNTER — Ambulatory Visit (INDEPENDENT_AMBULATORY_CARE_PROVIDER_SITE_OTHER): Payer: 59

## 2020-01-11 DIAGNOSIS — R0602 Shortness of breath: Secondary | ICD-10-CM | POA: Diagnosis not present

## 2020-01-11 DIAGNOSIS — R05 Cough: Secondary | ICD-10-CM | POA: Diagnosis not present

## 2020-01-11 DIAGNOSIS — U071 COVID-19: Secondary | ICD-10-CM | POA: Diagnosis not present

## 2020-01-11 DIAGNOSIS — R062 Wheezing: Secondary | ICD-10-CM | POA: Diagnosis not present

## 2020-01-11 MED ORDER — PREDNISONE 50 MG PO TABS: 50.0000 mg | ORAL_TABLET | Freq: Every day | ORAL | 0 refills | Status: DC

## 2020-01-11 NOTE — ED Provider Notes (Signed)
[facility] - [place], [place]   Name: Joe Richardson DOB: Feb 03, 1971 MRN: [id number] CSN: [id number] PCP: Barbette Reichmann, MD  Arrival date and time:  01/11/20 0831  Chief Complaint:  Cough   NOTE: Prior to seeing the patient today, I have reviewed the triage nursing documentation and vital signs. Clinical staff has updated patient's PMH/PSHx, current medication list, and drug allergies/intolerances to ensure comprehensive history available to assist in medical decision making.   History:   HPI: Joe Richardson is a 49 y.o. male who presents today with complaints of worsening cough in the setting of a positive COVID-19 diagnosis.  Patient started feeling Covid symptoms approximately 10 days ago, and was tested on Tuesday, January 26.  He received a positive result at that time and has since quarantined at home.  Patient states his symptoms started to resolve on Thursday, but Sunday night he started feeling worse.  He noticed his cough and chills have returned and he noticed increased shortness of breath.  He attempted to treat his symptoms with the previous prescribed albuterol inhaler and over-the-counter Mucinex.  Minimal results are noted with the aforementioned treatment, therefore he is here seeking further evaluation.  He denies any recent fevers, no recent antibiotic use.  Pertinent medical history includes mild asthma.   Past Medical History:  Diagnosis Date  . Anxiety   . Diabetes mellitus without complication (HCC)    pre-diabetes  . Hyperlipidemia     Past Surgical History:  Procedure Laterality Date  . lipoma removal Left    underarm  . TRIGGER FINGER RELEASE    . WISDOM TOOTH EXTRACTION      Family History  Problem Relation Age of Onset  . Diabetes Mother   . Asthma Mother   . Diabetes Father     Social History   Tobacco Use  . Smoking status: Never Smoker  . Smokeless tobacco: Never Used  Substance Use Topics  . Alcohol use: Yes    Alcohol/week: 3.0  standard drinks    Types: 3 Cans of beer per week    Comment: occasionally  . Drug use: No    There are no problems to display for this patient.   Home Medications:    Current Meds  Medication Sig  . albuterol (PROVENTIL HFA;VENTOLIN HFA) 108 (90 Base) MCG/ACT inhaler Inhale 1-2 puffs into the lungs every 4 (four) hours as needed for wheezing or shortness of breath.  . metFORMIN (GLUCOPHAGE-XR) 500 MG 24 hr tablet   . montelukast (SINGULAIR) 10 MG tablet Take 10 mg by mouth at bedtime.  Marland Kitchen venlafaxine XR (EFFEXOR-XR) 150 MG 24 hr capsule Take 1 capsule by mouth daily.    Allergies:   Patient has no known allergies.  Review of Systems (ROS): Review of Systems  Constitutional: Negative for activity change, appetite change, chills, diaphoresis and fatigue.  HENT: Negative for congestion, postnasal drip, sinus pain, sneezing and sore throat.   Respiratory: Positive for cough, shortness of breath and wheezing.   Cardiovascular: Negative for chest pain.  Gastrointestinal: Negative for diarrhea, nausea and vomiting.  All other systems reviewed and are negative.    Vital Signs: Today's Vitals   01/11/20 0854 01/11/20 0855 01/11/20 0857  BP:   (!) 135/102  Pulse:   92  Resp:   18  Temp:   98.8 F (37.1 C)  TempSrc:   Oral  SpO2:   95%  Weight:  210 lb (95.3 kg)   Height:  5\' 7"  (1.702  m)   PainSc: 3       Physical Exam: Physical Exam Constitutional:      Appearance: Normal appearance.  HENT:     Right Ear: Tympanic membrane normal.     Left Ear: Tympanic membrane normal.     Nose: Nose normal.     Mouth/Throat:     Mouth: Mucous membranes are moist.     Pharynx: No posterior oropharyngeal erythema.  Cardiovascular:     Rate and Rhythm: Normal rate and regular rhythm.     Pulses: Normal pulses.     Heart sounds: Normal heart sounds.  Pulmonary:     Effort: Pulmonary effort is normal.     Breath sounds: Normal breath sounds.  Lymphadenopathy:     Cervical: No  cervical adenopathy.  Skin:    General: Skin is warm and dry.  Neurological:     Mental Status: He is alert.  Psychiatric:        Mood and Affect: Mood normal.        Behavior: Behavior normal.      Urgent Care Treatments / Results:   LABS: PLEASE NOTE: all labs that were ordered this encounter are listed, however only abnormal results are displayed. Labs Reviewed - No data to display  EKG: -None  RADIOLOGY: DG Chest 2 View  Result Date: 01/11/2020 CLINICAL DATA:  Cough, COVID positive EXAM: CHEST - 2 VIEW COMPARISON:  None. FINDINGS: Lungs are clear.  No pleural effusion or pneumothorax. The heart is normal in size. Visualized osseous structures are within normal limits. IMPRESSION: Normal chest radiographs. Electronically Signed   By: Charline Bills M.D.   On: 01/11/2020 09:52    PROCEDURES: Procedures  MEDICATIONS RECEIVED THIS VISIT: Medications - No data to display  PERTINENT CLINICAL COURSE NOTES/UPDATES:   Initial Impression / Assessment and Plan / Urgent Care Course:  Pertinent labs & imaging results that were available during my care of the patient were personally reviewed by me and considered in my medical decision making (see lab/imaging section of note for values and interpretations).  Joe Richardson is a 49 y.o. male who presents to [facility] today with complaints of cough, diagnosed with cough in the setting of COVID-19, and treated as such with the medications below. NP and patient reviewed discharge instructions below during visit.   Patient is well appearing overall in clinic today. He does not appear to be in any acute distress. Presenting symptoms (see HPI) and exam as documented above.   I have reviewed the follow up and strict return precautions for any new or worsening symptoms. Patient is aware of symptoms that would be deemed urgent/emergent, and would thus require further evaluation either here or in the emergency department. At the time of  discharge, he verbalized understanding and consent with the discharge plan as it was reviewed with him. All questions were fielded by provider and/or clinic staff prior to patient discharge.    Final Clinical Impressions / Urgent Care Diagnoses:   Final diagnoses:  COVID-19    New Prescriptions:  [place] Controlled Substance Registry consulted? Not Applicable  Meds ordered this encounter  Medications  . predniSONE (DELTASONE) 50 MG tablet    Sig: Take 1 tablet (50 mg total) by mouth daily with breakfast.    Dispense:  5 tablet    Refill:  0      Discharge Instructions     You are seen for worsening cough for COVID-19 diagnosis.  No pneumonia was seen on  your chest x-ray so we will continue to treat symptomatically.  Take your steroids as prescribed.  Use albuterol inhaler every 4 hours for 2 days, and then every 4-6 hours as needed for shortness of breath.  If your shortness of breath or cough starts to worsen, seek further care.  I recommend that you stay quarantined until February 6.    Recommended Follow up Care:  Patient encouraged to follow up with the following provider within the specified time frame, or sooner as dictated by the severity of his symptoms. As always, he was instructed that for any urgent/emergent care needs, he should seek care either here or in the emergency department for more immediate evaluation.   Gertie Baron, DNP, NP-c    Gertie Baron, NP 01/11/20 1013

## 2020-01-11 NOTE — ED Triage Notes (Signed)
Patient tested positive for COVID on Tuesday 01/26. He is here today c/o cough.

## 2020-01-11 NOTE — Discharge Instructions (Addendum)
You are seen for worsening cough for COVID-19 diagnosis.  No pneumonia was seen on your chest x-ray so we will continue to treat symptomatically.  Take your steroids as prescribed.  Use albuterol inhaler every 4 hours for 2 days, and then every 4-6 hours as needed for shortness of breath.  If your shortness of breath or cough starts to worsen, seek further care.  I recommend that you stay quarantined until February 6.

## 2020-01-25 ENCOUNTER — Other Ambulatory Visit: Payer: Self-pay

## 2020-01-25 ENCOUNTER — Emergency Department
Admission: EM | Admit: 2020-01-25 | Discharge: 2020-01-26 | Disposition: A | Discharge status: Home / Self Care | Payer: 59 | Attending: Emergency Medicine | Admitting: Emergency Medicine

## 2020-01-25 DIAGNOSIS — E785 Hyperlipidemia, unspecified: Secondary | ICD-10-CM | POA: Insufficient documentation

## 2020-01-25 DIAGNOSIS — R739 Hyperglycemia, unspecified: Secondary | ICD-10-CM

## 2020-01-25 DIAGNOSIS — Z79899 Other long term (current) drug therapy: Secondary | ICD-10-CM | POA: Diagnosis not present

## 2020-01-25 DIAGNOSIS — E1165 Type 2 diabetes mellitus with hyperglycemia: Secondary | ICD-10-CM | POA: Insufficient documentation

## 2020-01-25 DIAGNOSIS — Z7984 Long term (current) use of oral hypoglycemic drugs: Secondary | ICD-10-CM | POA: Diagnosis not present

## 2020-01-25 DIAGNOSIS — R631 Polydipsia: Secondary | ICD-10-CM | POA: Diagnosis present

## 2020-01-25 LAB — CBC
HCT: 49.2 % (ref 39.0–52.0)
Hemoglobin: 16.9 g/dL (ref 13.0–17.0)
MCH: 29.3 pg (ref 26.0–34.0)
MCHC: 34.3 g/dL (ref 30.0–36.0)
MCV: 85.4 fL (ref 80.0–100.0)
Platelets: 245 10*3/uL (ref 150–400)
RBC: 5.76 MIL/uL (ref 4.22–5.81)
RDW: 12.8 % (ref 11.5–15.5)
WBC: 7.4 10*3/uL (ref 4.0–10.5)
nRBC: 0 % (ref 0.0–0.2)

## 2020-01-25 LAB — URINALYSIS, COMPLETE (UACMP) WITH MICROSCOPIC
Bacteria, UA: NONE SEEN
Bilirubin Urine: NEGATIVE
Glucose, UA: >=500 mg/dL — AB
Hgb urine dipstick: NEGATIVE
Ketones, ur: 80 mg/dL — AB
Leukocytes,Ua: NEGATIVE
Nitrite: NEGATIVE
Protein, ur: 100 mg/dL — AB
Specific Gravity, Urine: 1.036 — ABNORMAL HIGH (ref 1.005–1.030)
Squamous Epithelial / LPF: NONE SEEN (ref 0–5)
pH: 6 (ref 5.0–8.0)

## 2020-01-25 LAB — GLUCOSE, CAPILLARY
Glucose-Capillary: 410 mg/dL — ABNORMAL HIGH (ref 70–99)
Glucose-Capillary: 437 mg/dL — ABNORMAL HIGH (ref 70–99)

## 2020-01-25 NOTE — ED Triage Notes (Signed)
Pt to the er for symptoms of diabetes. Pt had a covid DX on January 26th. Pt is past the two week mark. Pt is prediabetic and taking 1000 mg of metformin BID. Pt bought a glucometer and began checking blood sugar. Pt noted symptoms of excessive thirst, blurry vision, and increased urination. Pt last PO intake at 1830. Pt states he lost 20 pounds since covid.

## 2020-01-26 LAB — BASIC METABOLIC PANEL
Anion gap: 16 — ABNORMAL HIGH (ref 5–15)
Anion gap: 11 (ref 5–15)
BUN: 14 mg/dL (ref 6–20)
BUN: 14 mg/dL (ref 6–20)
CO2: 24 mmol/L (ref 22–32)
CO2: 23 mmol/L (ref 22–32)
Calcium: 9.4 mg/dL (ref 8.9–10.3)
Calcium: 8.2 mg/dL — ABNORMAL LOW (ref 8.9–10.3)
Chloride: 92 mmol/L — ABNORMAL LOW (ref 98–111)
Chloride: 98 mmol/L (ref 98–111)
Creatinine, Ser: 0.99 mg/dL (ref 0.61–1.24)
Creatinine, Ser: 0.77 mg/dL (ref 0.61–1.24)
GFR calc Af Amer: >60 mL/min (ref >60)
GFR calc Af Amer: >60 mL/min (ref >60)
GFR calc non Af Amer: >60 mL/min (ref >60)
GFR calc non Af Amer: >60 mL/min (ref >60)
Glucose, Bld: 467 mg/dL — ABNORMAL HIGH (ref 70–99)
Glucose, Bld: 301 mg/dL — ABNORMAL HIGH (ref 70–99)
Potassium: 4.2 mmol/L (ref 3.5–5.1)
Potassium: 3.5 mmol/L (ref 3.5–5.1)
Sodium: 132 mmol/L — ABNORMAL LOW (ref 135–145)
Sodium: 132 mmol/L — ABNORMAL LOW (ref 135–145)

## 2020-01-26 LAB — BLOOD GAS, VENOUS
Acid-Base Excess: 2 mmol/L (ref 0.0–2.0)
Bicarbonate: 27.8 mmol/L (ref 20.0–28.0)
O2 Saturation: 60.1 %
Patient temperature: 37
pCO2, Ven: 47 mmHg (ref 44.0–60.0)
pH, Ven: 7.38 (ref 7.250–7.430)
pO2, Ven: 32 mmHg (ref 32.0–45.0)

## 2020-01-26 LAB — GLUCOSE, CAPILLARY: Glucose-Capillary: 341 mg/dL — ABNORMAL HIGH (ref 70–99)

## 2020-01-26 MED ORDER — INSULIN ASPART 100 UNIT/ML ~~LOC~~ SOLN
5.0000 [IU] | Freq: Once | SUBCUTANEOUS | Status: AC
  Administered 2020-01-26: 5 [IU] via INTRAVENOUS
  Filled 2020-01-26: qty 1

## 2020-01-26 MED ORDER — SODIUM CHLORIDE 0.9 % IV SOLN
1000.0000 mL | Freq: Once | INTRAVENOUS | Status: AC
  Administered 2020-01-26: 1000 mL via INTRAVENOUS

## 2020-01-26 NOTE — ED Provider Notes (Signed)
[facility] Emergency Department Provider Note   ____________________________________________    I have reviewed the triage vital signs and the nursing notes.   HISTORY  Chief Complaint Diabetes     HPI Joe Richardson is a 49 y.o. male who reports that he was told that he was prediabetic and has been on Metformin 1000 g twice daily x2 years.  Reports over the last week and a half he is become very thirsty and had frequent urination.  He denies abdominal pain.  No nausea or vomiting.  Some blurry vision reported.  No history of DKA.  Has not seen his PCP recently.  No fevers or chills.  No dysuria.  Has not take anything for this.  Has been compliant with his Metformin  Past Medical History:  Diagnosis Date  . Anxiety   . Diabetes mellitus without complication (HCC)    pre-diabetes  . Hyperlipidemia     There are no problems to display for this patient.   Past Surgical History:  Procedure Laterality Date  . lipoma removal Left    underarm  . TRIGGER FINGER RELEASE    . WISDOM TOOTH EXTRACTION      Prior to Admission medications   Medication Sig Start Date End Date Taking? Authorizing Provider  albuterol (PROVENTIL HFA;VENTOLIN HFA) 108 (90 Base) MCG/ACT inhaler Inhale 1-2 puffs into the lungs every 4 (four) hours as needed for wheezing or shortness of breath. 07/24/18   Wynona Luna, MD  amoxicillin-clavulanate (AUGMENTIN XR) 1000-62.5 MG 12 hr tablet Take 2 tablets by mouth 2 (two) times daily. 06/03/19   Karen Kitchens, NP  metFORMIN (GLUCOPHAGE-XR) 500 MG 24 hr tablet  05/07/18   [provider]  montelukast (SINGULAIR) 10 MG tablet Take 10 mg by mouth at bedtime.    [provider]  predniSONE (DELTASONE) 50 MG tablet Take 1 tablet (50 mg total) by mouth daily with breakfast. 01/11/20   Gertie Baron, NP  venlafaxine XR (EFFEXOR-XR) 150 MG 24 hr capsule Take 1 capsule by mouth daily. 03/17/17   [provider]    atorvastatin (LIPITOR) 40 MG tablet  05/07/18 05/27/19  [provider]     Allergies Patient has no known allergies.  Family History  Problem Relation Age of Onset  . Diabetes Mother   . Asthma Mother   . Diabetes Father     Social History Social History   Tobacco Use  . Smoking status: Never Smoker  . Smokeless tobacco: Never Used  Substance Use Topics  . Alcohol use: Yes    Alcohol/week: 3.0 standard drinks    Types: 3 Cans of beer per week    Comment: occasionally  . Drug use: No    Review of Systems  Constitutional: No fever/chills Eyes: As above ENT: No sore throat. Cardiovascular: Denies chest pain. Respiratory: Denies shortness of breath. Gastrointestinal: As above Genitourinary: Negative for dysuria. Musculoskeletal: Negative for back pain. Skin: Negative for rash. Neurological: Negative for headaches   ____________________________________________   PHYSICAL EXAM:  VITAL SIGNS: ED Triage Vitals  Enc Vitals Group     BP 01/25/20 2220 (!) 150/103     Pulse Rate 01/25/20 2220 (!) 109     Resp 01/25/20 2220 18     Temp 01/25/20 2220 98.4 F (36.9 C)     Temp Source 01/25/20 2220 Oral     SpO2 01/25/20 2220 96 %     Weight 01/25/20 2222 91.6 kg (202 lb)  Height 01/25/20 2222 1.702 m (5\' 7" )     Head Circumference --      Peak Flow --      Pain Score 01/25/20 2221 0     Pain Loc --      Pain Edu? --      Excl. in GC? --     Constitutional: Alert and oriented.   Nose: No congestion/rhinnorhea. Mouth/Throat: Mucous membranes are moist.   Neck:  Painless ROM Cardiovascular: Mild tachycardia, regular rhythm. Grossly normal heart sounds.  Good peripheral circulation. Respiratory: Normal respiratory effort.  No retractions. Lungs CTAB. Gastrointestinal: Soft and nontender. No distention.  No CVA tenderness.  Musculoskeletal: No lower extremity tenderness nor edema.  Warm and well perfused Neurologic:  Normal speech and language. No  gross focal neurologic deficits are appreciated.  Skin:  Skin is warm, dry and intact. No rash noted. Psychiatric: Mood and affect are normal. Speech and behavior are normal.  ____________________________________________   LABS (all labs ordered are listed, but only abnormal results are displayed)  Labs Reviewed  BASIC METABOLIC PANEL - Abnormal; Notable for the following components:      Result Value   Sodium 132 (*)    Chloride 92 (*)    Glucose, Bld 467 (*)    Anion gap 16 (*)    All other components within normal limits  URINALYSIS, COMPLETE (UACMP) WITH MICROSCOPIC - Abnormal; Notable for the following components:   Color, Urine STRAW (*)    APPearance CLEAR (*)    Specific Gravity, Urine 1.036 (*)    Glucose, UA >=500 (*)    Ketones, ur 80 (*)    Protein, ur 100 (*)    All other components within normal limits  GLUCOSE, CAPILLARY - Abnormal; Notable for the following components:   Glucose-Capillary 437 (*)    All other components within normal limits  GLUCOSE, CAPILLARY - Abnormal; Notable for the following components:   Glucose-Capillary 410 (*)    All other components within normal limits  GLUCOSE, CAPILLARY - Abnormal; Notable for the following components:   Glucose-Capillary 341 (*)    All other components within normal limits  BASIC METABOLIC PANEL - Abnormal; Notable for the following components:   Sodium 132 (*)    Glucose, Bld 301 (*)    Calcium 8.2 (*)    All other components within normal limits  CBC  BLOOD GAS, VENOUS  CBG MONITORING, ED  CBG MONITORING, ED   ____________________________________________  EKG  None ____________________________________________  RADIOLOGY   ____________________________________________   PROCEDURES  Procedure(s) performed: No  Procedures   Critical Care performed:No ____________________________________________   INITIAL IMPRESSION / ASSESSMENT AND PLAN / ED COURSE  Pertinent labs & imaging results  that were available during my care of the patient were reviewed by me and considered in my medical decision making (see chart for details).  Patient presents with polydipsia, polyuria, elevated glucose.  Symptoms may be related to hypoglycemia alone versus DKA.  Mild tachycardia noted.  Will treat with IV fluids while we await labs.  Positive ketones noted on urine, anion gap of 16, although bicarb normal.  Will obtain VBG  VBG demonstrates normal pH.  Patient given insulin and IV fluids  Repeat BMP is unremarkable, recommend close follow-up with PCP for adjustment of diabetes medications    ____________________________________________   FINAL CLINICAL IMPRESSION(S) / ED DIAGNOSES  Final diagnoses:  Hyperglycemia        Note:  This document was prepared using Dragon voice recognition  software and may include unintentional dictation errors.   Jene Every, MD 01/26/20 [phone]

## 2020-07-09 ENCOUNTER — Encounter: Payer: Self-pay | Admitting: Emergency Medicine

## 2020-07-09 ENCOUNTER — Other Ambulatory Visit: Payer: Self-pay

## 2020-07-09 ENCOUNTER — Ambulatory Visit
Admission: EM | Admit: 2020-07-09 | Discharge: 2020-07-09 | Disposition: A | Discharge status: Home / Self Care | Payer: 59 | Attending: Family Medicine | Admitting: Family Medicine

## 2020-07-09 DIAGNOSIS — T63441A Toxic effect of venom of bees, accidental (unintentional), initial encounter: Secondary | ICD-10-CM | POA: Diagnosis not present

## 2020-07-09 MED ORDER — HYDROXYZINE HCL 25 MG PO TABS: 25.0000 mg | ORAL_TABLET | Freq: Three times a day (TID) | ORAL | 0 refills | Status: DC | PRN

## 2020-07-09 MED ORDER — PREDNISONE 10 MG (21) PO TBPK: ORAL_TABLET | ORAL | 0 refills | Status: DC

## 2020-07-09 NOTE — ED Triage Notes (Signed)
Patient states that he got stung by a wasp on Thursday in his right hand.  Patient c/o redness, swelling and tenderness at the site.  Patient denies fevers.

## 2020-07-09 NOTE — Discharge Instructions (Signed)
Medication as prescribed.  Zyrtec 10 mg daily for the next week.  Ice, elevate.  Take care  Dr. Adriana Simas

## 2020-07-09 NOTE — ED Provider Notes (Signed)
[facility]    CSN: [id number] Arrival date & time: 07/09/20  1004  History   Chief Complaint Chief Complaint  Patient presents with  . Insect Bite    wasp   HPI  49 year old male presents with the above complaint.  Patient states that he got stung by a bee on Thursday (right hand).  He states that he believes it was a wasp or hornet.  He states that his hand is quite swollen and red and also slightly tender.  Pain described as a burning sensation.  3/10 in severity.  He does report some associated itching.  No relieving factors.  No other reported injury.  No other complaints at this time.  Past Medical History:  Diagnosis Date  . Anxiety   . Diabetes mellitus without complication (HCC)    pre-diabetes  . Hyperlipidemia    Past Surgical History:  Procedure Laterality Date  . lipoma removal Left    underarm  . TRIGGER FINGER RELEASE    . WISDOM TOOTH EXTRACTION     Home Medications    Prior to Admission medications   Medication Sig Start Date End Date Taking? Authorizing Provider  atorvastatin (LIPITOR) 40 MG tablet Take 40 mg by mouth daily. 06/12/20  Yes [provider]  FARXIGA 10 MG TABS tablet Take 10 mg by mouth daily. 06/26/20  Yes [provider]  JANUVIA 100 MG tablet Take 100 mg by mouth daily. 04/22/20  Yes [provider]  lisinopril (ZESTRIL) 5 MG tablet Take 5 mg by mouth daily. 06/12/20  Yes [provider]  metFORMIN (GLUCOPHAGE-XR) 500 MG 24 hr tablet  05/07/18  Yes [provider]  montelukast (SINGULAIR) 10 MG tablet Take 10 mg by mouth at bedtime.   Yes [provider]  venlafaxine XR (EFFEXOR-XR) 150 MG 24 hr capsule Take 1 capsule by mouth daily. 03/17/17  Yes [provider]  albuterol (PROVENTIL HFA;VENTOLIN HFA) 108 (90 Base) MCG/ACT inhaler Inhale 1-2 puffs into the lungs every 4 (four) hours as needed for wheezing or shortness of breath. 07/24/18   Joe Rankin, MD    hydrOXYzine (ATARAX/VISTARIL) 25 MG tablet Take 1 tablet (25 mg total) by mouth every 8 (eight) hours as needed for itching. 07/09/20   Joe Sams, DO  predniSONE (STERAPRED UNI-PAK 21 TAB) 10 MG (21) TBPK tablet 6 tablets on day 1; decrease by 1 tablet daily until gone. 07/09/20   Joe Sams, DO    Family History Family History  Problem Relation Age of Onset  . Diabetes Mother   . Asthma Mother   . Diabetes Father     Social History Social History   Tobacco Use  . Smoking status: Never Smoker  . Smokeless tobacco: Never Used  Vaping Use  . Vaping Use: Never used  Substance Use Topics  . Alcohol use: Yes    Alcohol/week: 3.0 standard drinks    Types: 3 Cans of beer per week    Comment: occasionally  . Drug use: No     Allergies   Patient has no known allergies.   Review of Systems Review of Systems  Respiratory: Negative.   Skin:       Bee sting - Right hand. Redness, swelling, itching & burning.   Physical Exam Triage Vital Signs ED Triage Vitals  Enc Vitals Group     BP 07/09/20 1023 117/82     Pulse Rate 07/09/20 1023 78     Resp 07/09/20 1023 16  Temp 07/09/20 1023 98.2 F (36.8 C)     Temp Source 07/09/20 1023 Oral     SpO2 07/09/20 1023 98 %     Weight 07/09/20 1020 194 lb (88 kg)     Height 07/09/20 1020 5\' 8"  (1.727 m)     Head Circumference --      Peak Flow --      Pain Score 07/09/20 1020 3     Pain Loc --      Pain Edu? --      Excl. in GC? --    Updated Vital Signs BP 117/82 (BP Location: Left Arm)   Pulse 78   Temp 98.2 F (36.8 C) (Oral)   Resp 16   Ht 5\' 8"  (1.727 m)   Wt 88 kg   SpO2 98%   BMI 29.50 kg/m   Visual Acuity Right Eye Distance:   Left Eye Distance:   Bilateral Distance:    Right Eye Near:   Left Eye Near:    Bilateral Near:     Physical Exam Vitals and nursing note reviewed.  Constitutional:      General: He is not in acute distress.    Appearance: Normal appearance. He is not ill-appearing.   HENT:     Head: Normocephalic and atraumatic.  Cardiovascular:     Rate and Rhythm: Normal rate and regular rhythm.  Pulmonary:     Effort: Pulmonary effort is normal. No respiratory distress.     Breath sounds: Normal breath sounds. No wheezing, rhonchi or rales.  Skin:    Comments: Right hand with severe swelling and erythema.  Mild warmth.  Neurological:     Mental Status: He is alert.  Psychiatric:        Mood and Affect: Mood normal.        Behavior: Behavior normal.    UC Treatments / Results  Labs (all labs ordered are listed, but only abnormal results are displayed) Labs Reviewed - No data to display  EKG   Radiology No results found.  Procedures Procedures (including critical care time)  Medications Ordered in UC Medications - No data to display  Initial Impression / Assessment and Plan / UC Course  I have reviewed the triage vital signs and the nursing notes.  Pertinent labs & imaging results that were available during my care of the patient were reviewed by me and considered in my medical decision making (see chart for details).    49 year old male presents with a bee sting reaction.  Treating with brief course of prednisone.  Also placing on Atarax and Zyrtec.  Advised ice and elevation.  Final Clinical Impressions(s) / UC Diagnoses   Final diagnoses:  Bee sting reaction, accidental or unintentional, initial encounter     Discharge Instructions     Medication as prescribed.  Zyrtec 10 mg daily for the next week.  Ice, elevate.  Take care  Dr.    ED Prescriptions    Medication Sig Dispense Auth. Provider   predniSONE (STERAPRED UNI-PAK 21 TAB) 10 MG (21) TBPK tablet 6 tablets on day 1; decrease by 1 tablet daily until gone. 21 tablet Milan Clare G, DO   hydrOXYzine (ATARAX/VISTARIL) 25 MG tablet Take 1 tablet (25 mg total) by mouth every 8 (eight) hours as needed for itching. 30 tablet Adriana Simas, DO     PDMP not reviewed this  encounter.   01-13-2004, Joe Richardson 07/09/20 1047

## 2020-12-25 ENCOUNTER — Ambulatory Visit: Payer: 59

## 2021-01-12 ENCOUNTER — Other Ambulatory Visit: Payer: Self-pay

## 2021-01-12 ENCOUNTER — Ambulatory Visit (INDEPENDENT_AMBULATORY_CARE_PROVIDER_SITE_OTHER): Payer: 59 | Admitting: Urology

## 2021-01-12 ENCOUNTER — Encounter: Payer: Self-pay | Admitting: Urology

## 2021-01-12 VITALS — BP 134/91 | HR 83 | Ht 68.0 in | Wt 213.0 lb

## 2021-01-12 DIAGNOSIS — Z3009 Encounter for other general counseling and advice on contraception: Secondary | ICD-10-CM

## 2021-01-12 NOTE — Progress Notes (Signed)
01/12/2021 10:31 AM   Laurita Quint 1971-03-26 [id number]  Referring provider: Barbette Reichmann, MD [address] [place],  [place] [address]  Chief Complaint  Patient presents with  . VAS Consult    HPI: 50 y.o. year old male referred for further evaluation of possible vasectomy.  Denies a history of testicular trauma or pain.  No urinary issues.  No previous scrotal surgeries.  He has 2 biological children the ghost of which is 15 years old.  He and his partner would desire no further biological children.   PMH: Past Medical History:  Diagnosis Date  . Anxiety   . Diabetes mellitus without complication (HCC)    pre-diabetes  . Hyperlipidemia     Surgical History: Past Surgical History:  Procedure Laterality Date  . lipoma removal Left    underarm  . TRIGGER FINGER RELEASE    . WISDOM TOOTH EXTRACTION      Home Medications:  Allergies as of 01/12/2021   No Known Allergies     Medication List       Accurate as of January 12, 2021 10:31 AM. If you have any questions, ask your nurse or doctor.        STOP taking these medications   predniSONE 10 MG (21) Tbpk tablet Commonly known as: STERAPRED UNI-PAK 21 TAB Stopped by: Vanna Scotland, MD     TAKE these medications   albuterol 108 (90 Base) MCG/ACT inhaler Commonly known as: VENTOLIN HFA Inhale 1-2 puffs into the lungs every 4 (four) hours as needed for wheezing or shortness of breath.   atorvastatin 40 MG tablet Commonly known as: LIPITOR Take 40 mg by mouth daily.   Farxiga 10 MG Tabs tablet Generic drug: dapagliflozin propanediol Take 10 mg by mouth daily.   hydrOXYzine 25 MG tablet Commonly known as: ATARAX/VISTARIL Take 1 tablet (25 mg total) by mouth every 8 (eight) hours as needed for itching.   Januvia 100 MG tablet Generic drug: sitaGLIPtin Take 100 mg by mouth daily.   lisinopril 5 MG tablet Commonly known as: ZESTRIL Take 5 mg by mouth daily.   metFORMIN 500 MG 24 hr tablet Commonly  known as: GLUCOPHAGE-XR   montelukast 10 MG tablet Commonly known as: SINGULAIR Take 10 mg by mouth at bedtime.   venlafaxine XR 150 MG 24 hr capsule Commonly known as: EFFEXOR-XR Take 1 capsule by mouth daily.       Allergies: No Known Allergies  Family History: Family History  Problem Relation Age of Onset  . Diabetes Mother   . Asthma Mother   . Diabetes Father     Social History:  reports that he has never smoked. He has never used smokeless tobacco. He reports current alcohol use of about 3.0 standard drinks of alcohol per week. He reports that he does not use drugs.   Physical Exam: BP (!) 134/91   Pulse 83   Ht 5\' 8"  (1.727 m)   Wt 213 lb (96.6 kg)   BMI 32.39 kg/m   Constitutional:  Alert and oriented, No acute distress. HEENT: [place] AT, moist mucus membranes.  Trachea midline, no masses. Cardiovascular: No clubbing, cyanosis, or edema. Respiratory: Normal respiratory effort, no increased work of breathing. GI: Abdomen is soft, nontender, nondistended, no abdominal masses GU: Normal phallus.  Bilateral descended testicles without masses.  His scrotum is quite tight, testicle is high riding.  Vasa difficult to palpate, presumably very posterior in location. Skin: No rashes, bruises or suspicious lesions. Neurologic: Grossly  intact, no focal deficits, moving all 4 extremities. Psychiatric: Normal mood and affect.  Laboratory Data: N/a  Urinalysis n/a  Pertinent Imaging: N/a  Assessment & Plan:    1. Vasectomy evaluation Today, we discussed what the vas deferens is, where it is located, and its function. We reviewed the procedure for vasectomy, it's risks, benefits, alternatives, and likelihood of achieving his goals. We discussed in detail the procedure, complications, and recovery as well as the need for clearance prior to unprotected intercourse. We discussed that vasectomy does not protect against sexually transmitted diseases. We discussed that this  procedure does not result in immediate sterility and that they would need to use other forms of birth control until he has been cleared with negative postvasectomy semen analyses. I explained that the procedure is considered to be permanent and that attempts at reversal have varying degrees of success. These options include vasectomy reversal, sperm retrieval, and in vitro fertilization; these can be very expensive. We discussed the chance of postvasectomy pain syndrome which occurs in less than 5% of patients. I explained to the patient that there is no treatment to resolve this chronic pain, and that if it developed I would not be able to help resolve the issue, but that surgery is generally not needed for correction. I explained there have even been reports of systemic like illness associated with this chronic pain, and that there was no good cure. I explained that vasectomy it is not a 100% reliable form of birth control, and the risk of pregnancy after vasectomy is approximately 1 in 2000 men who had a negative postvasectomy semen analysis or rare non-motile sperm. I explained that repeat vasectomy was necessary in less than 1% of vasectomy procedures when employing the type of technique that I use. I explained that he should refrain from ejaculation for approximately one week following vasectomy. I explained that there are other options for birth control which are permanent and non-permanent; we discussed these. I explained the rates of surgical complications, such as symptomatic hematoma or infection, are low (1-2%) and vary with the surgeon's experience and criteria used to diagnose the complication.   The patient had the opportunity to ask questions to his stated satisfaction. He voiced understanding of the above factors and stated that he has read all the information provided to him and the packets and informed consent.  We had a lengthy and honest discussion today.  His testicular/scrotal anatomy is  very challenging with high riding testicles, relatively tight scrotum and very posterior located vasa.  I offered to try in the office but I did more strongly recommend considering doing this in the operating room with monitored anesthesia care to optimize the success rate as well as discomfort.  He is agreeable this plan.   Vanna Scotland, MD  [facility] [address], [address] [place], [place] [address] [phone]

## 2021-01-17 ENCOUNTER — Other Ambulatory Visit: Payer: Self-pay | Admitting: Radiology

## 2021-01-17 DIAGNOSIS — Z302 Encounter for sterilization: Secondary | ICD-10-CM

## 2021-01-18 ENCOUNTER — Encounter: Payer: Self-pay | Admitting: Urology

## 2021-01-19 ENCOUNTER — Encounter: Payer: Self-pay | Admitting: Anesthesiology

## 2021-01-22 ENCOUNTER — Telehealth: Payer: Self-pay | Admitting: Radiology

## 2021-01-22 NOTE — Telephone Encounter (Signed)
Patient would like to cancel vasectomy scheduled with Dr Apolinar Junes at the [facility].

## 2021-01-26 ENCOUNTER — Ambulatory Visit: Admission: RE | Admit: 2021-01-26 | Payer: 59 | Source: Home / Self Care | Admitting: Urology

## 2021-01-26 HISTORY — DX: Headache, unspecified: R51.9

## 2021-01-26 HISTORY — DX: Gastro-esophageal reflux disease without esophagitis: K21.9

## 2021-01-26 HISTORY — DX: Allergy, unspecified, initial encounter: T78.40XA

## 2021-01-26 HISTORY — DX: Sleep apnea, unspecified: G47.30

## 2021-01-26 SURGERY — VASECTOMY
Anesthesia: Monitor Anesthesia Care | Laterality: Bilateral

## 2021-06-23 IMAGING — CR DG CHEST 2V
2 series · 3 of 3 positions shown · non-contrast
Comparison: None.

CLINICAL DATA: Cough, COVID positive

EXAM:
CHEST - 2 VIEW

[Series 1: chest pa · 0.14mm/px · 2 of 2 slices shown]
[im 1/2]
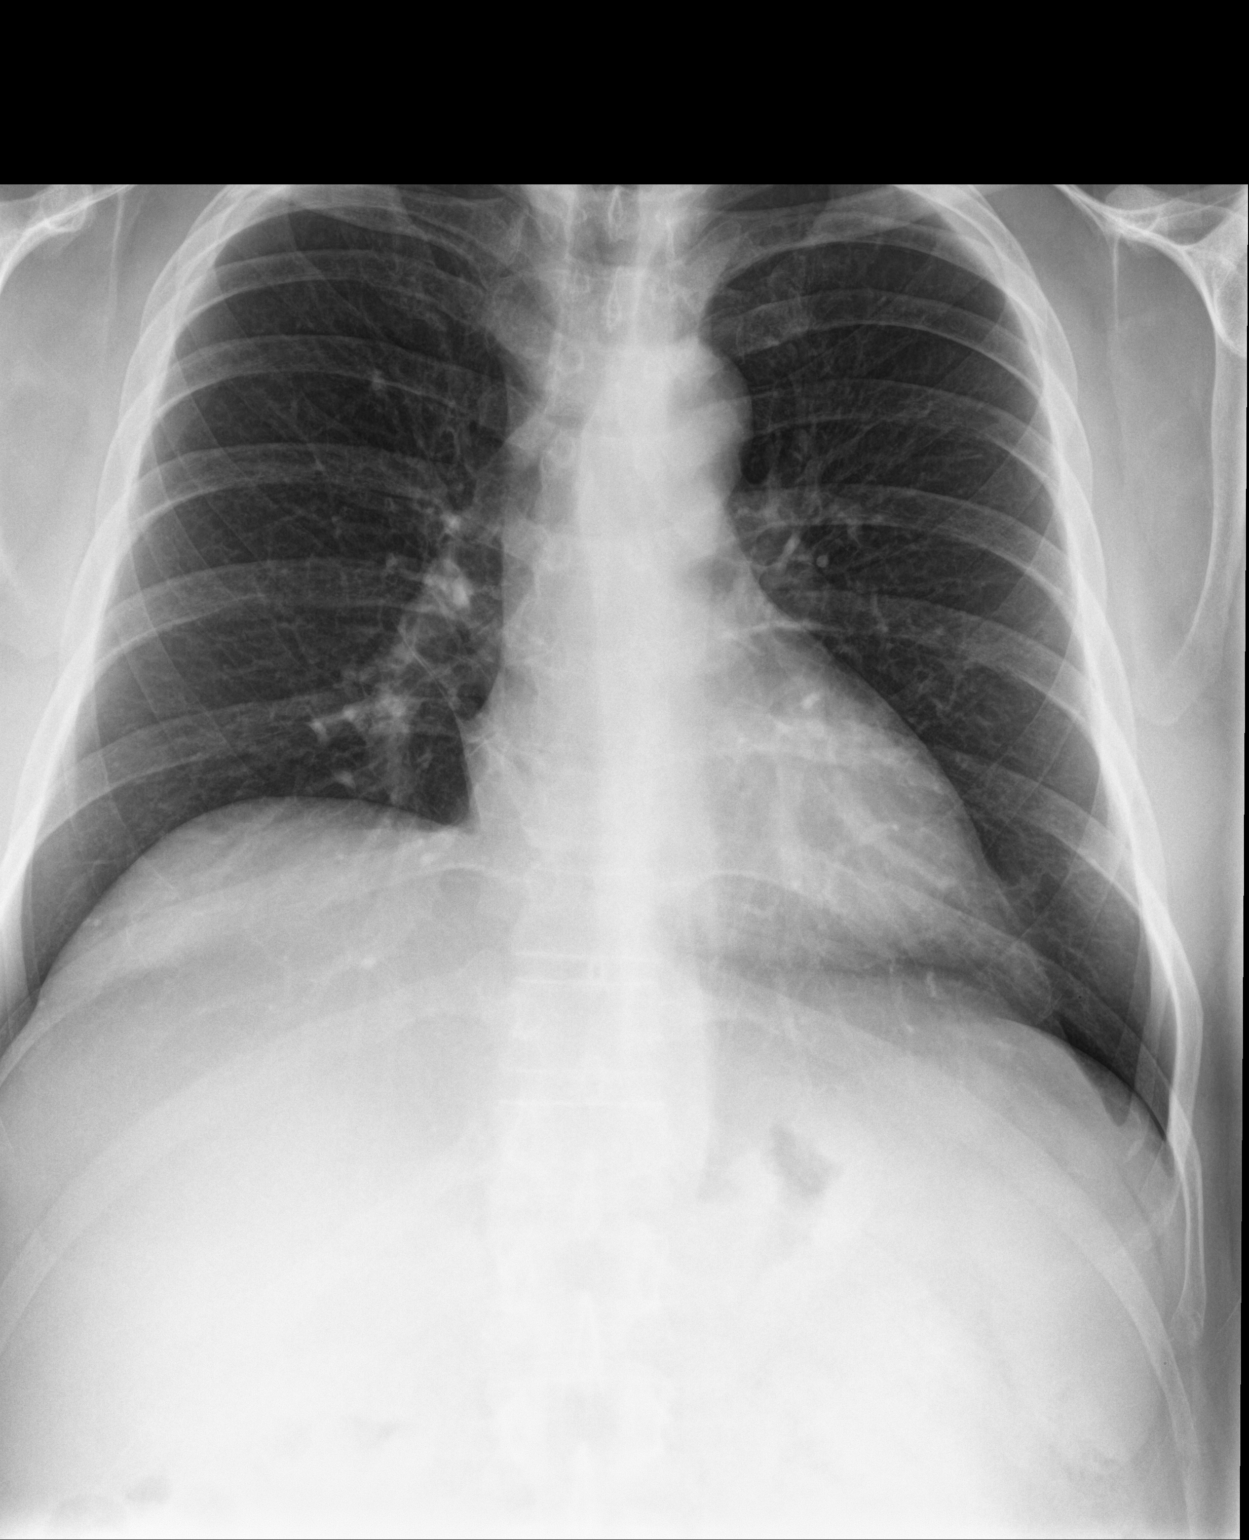
[im 2/2]
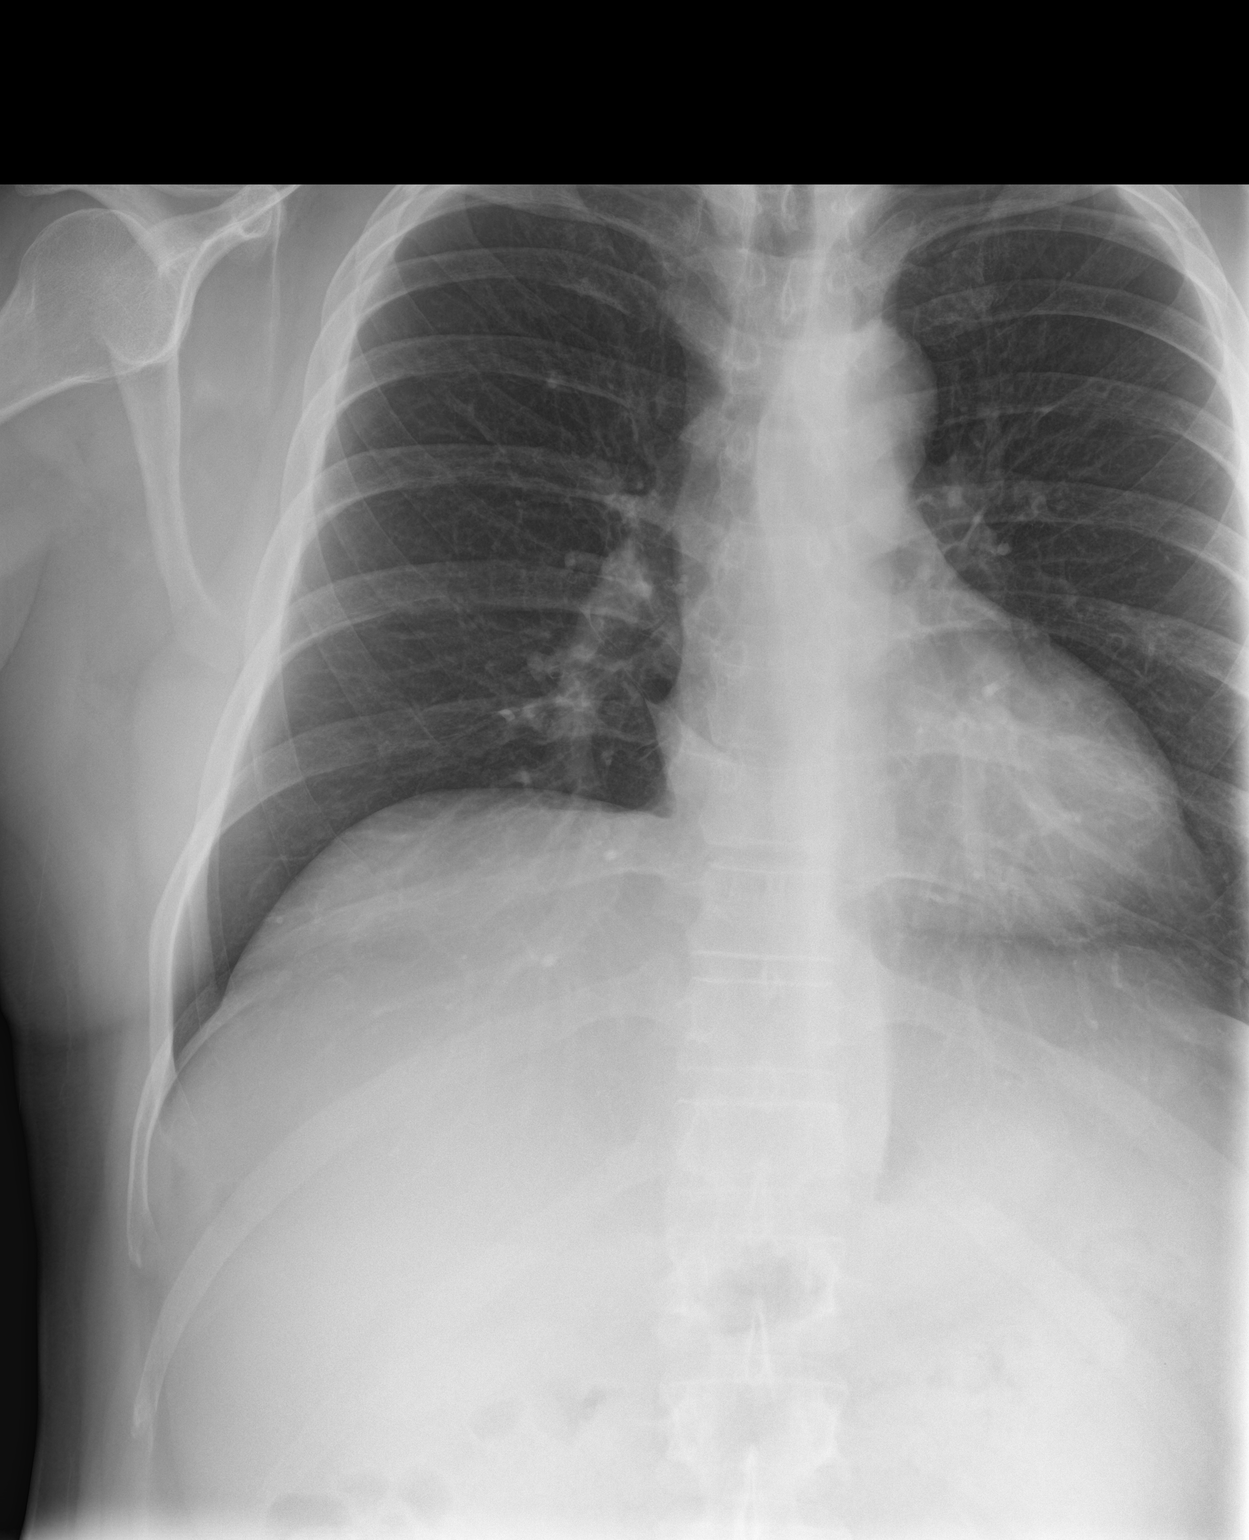

[chest lat]
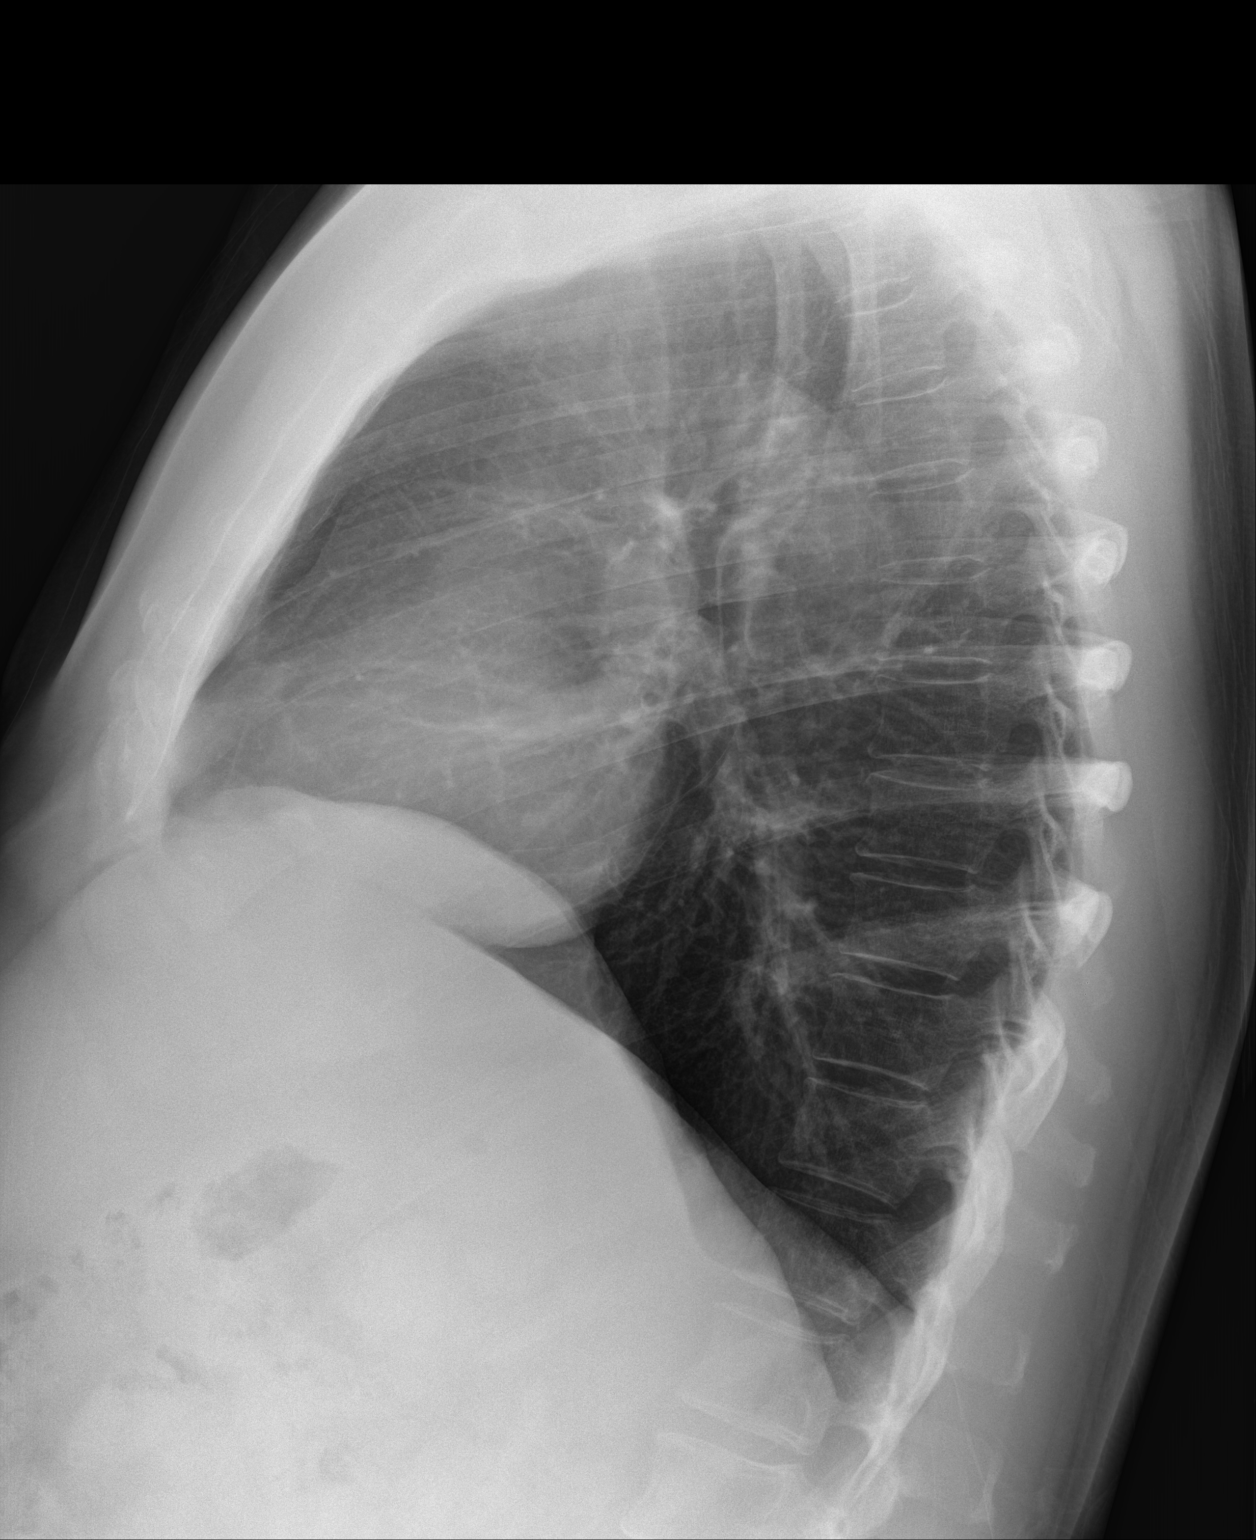

[3 of 3 positions shown; findings below may reference images not displayed]

FINDINGS: Lungs are clear.  No pleural effusion or pneumothorax.

The heart is normal in size.

Visualized osseous structures are within normal limits.
IMPRESSION: Normal chest radiographs.

## 2021-10-12 ENCOUNTER — Ambulatory Visit
Admission: EM | Admit: 2021-10-12 | Discharge: 2021-10-12 | Disposition: A | Discharge status: Home / Self Care | Payer: 59 | Attending: Emergency Medicine | Admitting: Emergency Medicine

## 2021-10-12 ENCOUNTER — Other Ambulatory Visit: Payer: Self-pay

## 2021-10-12 DIAGNOSIS — J069 Acute upper respiratory infection, unspecified: Secondary | ICD-10-CM

## 2021-10-12 MED ORDER — IPRATROPIUM BROMIDE 0.06 % NA SOLN
2.0000 | Freq: Four times a day (QID) | NASAL | 12 refills | Status: DC
Start: 2021-10-12 — End: 2023-11-13

## 2021-10-12 MED ORDER — PROMETHAZINE-DM 6.25-15 MG/5ML PO SYRP
5.0000 mL | ORAL_SOLUTION | Freq: Four times a day (QID) | ORAL | 0 refills | Status: DC | PRN
Start: 2021-10-12 — End: 2023-11-13

## 2021-10-12 MED ORDER — BENZONATATE 100 MG PO CAPS
200.0000 mg | ORAL_CAPSULE | Freq: Three times a day (TID) | ORAL | 0 refills | Status: DC
Start: 2021-10-12 — End: 2023-11-13

## 2021-10-12 NOTE — ED Provider Notes (Signed)
[facility]    CSN: [id number] Arrival date & time: 10/12/21  1124      History   Chief Complaint Chief Complaint  Patient presents with   Facial Pain   Cough    HPI Joe Richardson is a 50 y.o. male.   HPI  50 year old male here for evaluation of respiratory complaints.  Patient reports that for the last 3 days he has been experiencing bilateral ear pressure, sinus pressure, nasal congestion, and a cough.  Patient reports his cough became productive for yellow sputum yesterday.  He endorses clear nasal discharge, sore throat, and some mild shortness breath and wheezing in addition.  Patient has not had a fever or GI complaints.  Past Medical History:  Diagnosis Date   Allergies    Anxiety    Diabetes mellitus without complication (HCC)    pre-diabetes   GERD (gastroesophageal reflux disease)    Headache    Hyperlipidemia    Sleep apnea    uses CPAP    There are no problems to display for this patient.   Past Surgical History:  Procedure Laterality Date   lipoma removal Left    underarm   TRIGGER FINGER RELEASE     WISDOM TOOTH EXTRACTION         Home Medications    Prior to Admission medications   Medication Sig Start Date End Date Taking? Authorizing Provider  albuterol (PROVENTIL HFA;VENTOLIN HFA) 108 (90 Base) MCG/ACT inhaler Inhale 1-2 puffs into the lungs every 4 (four) hours as needed for wheezing or shortness of breath. 07/24/18  Yes Isa Rankin, MD  atorvastatin (LIPITOR) 40 MG tablet Take 40 mg by mouth daily. 06/12/20  Yes [provider]  benzonatate (TESSALON) 100 MG capsule Take 2 capsules (200 mg total) by mouth every 8 (eight) hours. 10/12/21  Yes Becky Augusta, NP  FARXIGA 10 MG TABS tablet Take 10 mg by mouth daily. 06/26/20  Yes [provider]  ipratropium (ATROVENT) 0.06 % nasal spray Place 2 sprays into both nostrils 4 (four) times daily. 10/12/21  Yes Becky Augusta, NP  JANUVIA 100 MG tablet Take 100 mg  by mouth daily. 04/22/20  Yes [provider]  lisinopril (ZESTRIL) 5 MG tablet Take 5 mg by mouth daily. 06/12/20  Yes [provider]  metFORMIN (GLUCOPHAGE-XR) 500 MG 24 hr tablet  05/07/18  Yes [provider]  montelukast (SINGULAIR) 10 MG tablet Take 10 mg by mouth at bedtime.   Yes [provider]  promethazine-dextromethorphan (PROMETHAZINE-DM) 6.25-15 MG/5ML syrup Take 5 mLs by mouth 4 (four) times daily as needed. 10/12/21  Yes Becky Augusta, NP  venlafaxine XR (EFFEXOR-XR) 150 MG 24 hr capsule Take 1 capsule by mouth daily. 03/17/17  Yes [provider]    Family History Family History  Problem Relation Age of Onset   Diabetes Mother    Asthma Mother    Diabetes Father     Social History Social History   Tobacco Use   Smoking status: Never   Smokeless tobacco: Never  Vaping Use   Vaping Use: Never used  Substance Use Topics   Alcohol use: Yes    Alcohol/week: 3.0 standard drinks    Types: 3 Cans of beer per week    Comment: occasionally   Drug use: No     Allergies   Patient has no known allergies.   Review of Systems Review of Systems  Constitutional:  Negative for activity change, appetite change and fever.  HENT:  Positive for congestion, ear pain, rhinorrhea, sinus pressure and sore throat.   Respiratory:  Positive for cough, shortness of breath and wheezing.   Gastrointestinal:  Negative for diarrhea, nausea and vomiting.  Skin:  Negative for rash.  Hematological: Negative.   Psychiatric/Behavioral: Negative.      Physical Exam Triage Vital Signs ED Triage Vitals  Enc Vitals Group     BP 10/12/21 1214 (!) 125/95     Pulse Rate 10/12/21 1214 77     Resp 10/12/21 1214 18     Temp 10/12/21 1214 98.6 F (37 C)     Temp Source 10/12/21 1214 Oral     SpO2 10/12/21 1214 99 %     Weight --      Height --      Head Circumference --      Peak Flow --      Pain Score 10/12/21 1211 4     Pain Loc --      Pain  Edu? --      Excl. in GC? --    No data found.  Updated Vital Signs BP (!) 125/95 (BP Location: Left Arm)   Pulse 77   Temp 98.6 F (37 C) (Oral)   Resp 18   SpO2 99%   Visual Acuity Right Eye Distance:   Left Eye Distance:   Bilateral Distance:    Right Eye Near:   Left Eye Near:    Bilateral Near:     Physical Exam Vitals and nursing note reviewed.  Constitutional:      General: He is not in acute distress.    Appearance: Normal appearance. He is not ill-appearing.  HENT:     Head: Normocephalic and atraumatic.     Right Ear: Tympanic membrane, ear canal and external ear normal. There is no impacted cerumen.     Left Ear: Tympanic membrane, ear canal and external ear normal. There is no impacted cerumen.     Nose: Congestion and rhinorrhea present.     Mouth/Throat:     Mouth: Mucous membranes are moist.     Pharynx: Oropharynx is clear. Posterior oropharyngeal erythema present.  Cardiovascular:     Rate and Rhythm: Normal rate and regular rhythm.     Pulses: Normal pulses.     Heart sounds: Normal heart sounds. No murmur heard.   No gallop.  Pulmonary:     Effort: Pulmonary effort is normal.     Breath sounds: Normal breath sounds. No wheezing, rhonchi or rales.  Musculoskeletal:     Cervical back: Normal range of motion and neck supple.  Lymphadenopathy:     Cervical: No cervical adenopathy.  Skin:    General: Skin is warm and dry.     Capillary Refill: Capillary refill takes less than 2 seconds.     Findings: No erythema or rash.  Neurological:     General: No focal deficit present.     Mental Status: He is alert and oriented to person, place, and time.  Psychiatric:        Mood and Affect: Mood normal.        Behavior: Behavior normal.        Thought Content: Thought content normal.        Judgment: Judgment normal.     UC Treatments / Results  Labs (all labs ordered are listed, but only abnormal results are displayed) Labs Reviewed - No data to  display  EKG   Radiology No results  found.  Procedures Procedures (including critical care time)  Medications Ordered in UC Medications - No data to display  Initial Impression / Assessment and Plan / UC Course  I have reviewed the triage vital signs and the nursing notes.  Pertinent labs & imaging results that were available during my care of the patient were reviewed by me and considered in my medical decision making (see chart for details).  Patient is a pleasant, nontoxic-appearing 50 year old male here for evaluation of respiratory complaints as outlined HPI above.  Patient's physical exam reveals pearly gray tympanic membranes bilaterally with normal light reflex and clear external auditory canals.  Nasal mucosa is erythematous and edematous with clear nasal discharge.  Oropharyngeal exam reveals posterior oropharyngeal erythema and injection with clear postnasal drip.  No cervical lymphadenopathy appreciated on exam.  Cardiopulmonary exam reveals clear lung sounds in all fields.  Patient exam is consistent with a viral URI with a cough and will treat accordingly with Atrovent nasal spray, Tessalon Perles, and Promethazine DM cough syrup.  Work note provided.   Final Clinical Impressions(s) / UC Diagnoses   Final diagnoses:  Viral URI with cough     Discharge Instructions      Use the Atrovent nasal spray, 2 squirts in each nostril every 6 hours, as needed for runny nose and postnasal drip.  Use the Tessalon Perles every 8 hours during the day.  Take them with a small sip of water.  They may give you some numbness to the base of your tongue or a metallic taste in your mouth, this is normal.  Use the Promethazine DM cough syrup at bedtime for cough and congestion.  It will make you drowsy so do not take it during the day.  Return for reevaluation or see your primary care provider for any new or worsening symptoms.      ED Prescriptions     Medication Sig Dispense  Auth. Provider   benzonatate (TESSALON) 100 MG capsule Take 2 capsules (200 mg total) by mouth every 8 (eight) hours. 21 capsule Becky Augusta, NP   ipratropium (ATROVENT) 0.06 % nasal spray Place 2 sprays into both nostrils 4 (four) times daily. 15 mL Becky Augusta, NP   promethazine-dextromethorphan (PROMETHAZINE-DM) 6.25-15 MG/5ML syrup Take 5 mLs by mouth 4 (four) times daily as needed. 118 mL Becky Augusta, NP      PDMP not reviewed this encounter.   Becky Augusta, NP 10/12/21 1317

## 2021-10-12 NOTE — Discharge Instructions (Signed)

## 2021-10-12 NOTE — ED Triage Notes (Signed)
Sinus pressure, chest congestion, bilateral ear pain, dry cough (improving) x 3 days.  Started coughing up yellow-green mucous yesterday. Has been using Mucinex.  No fevers.

## 2021-10-13 ENCOUNTER — Ambulatory Visit: Payer: Self-pay

## 2022-10-13 ENCOUNTER — Ambulatory Visit
Admission: EM | Admit: 2022-10-13 | Discharge: 2022-10-13 | Disposition: A | Discharge status: Home / Self Care | Payer: 59

## 2022-10-13 DIAGNOSIS — R0981 Nasal congestion: Secondary | ICD-10-CM | POA: Diagnosis not present

## 2022-10-13 DIAGNOSIS — H5789 Other specified disorders of eye and adnexa: Secondary | ICD-10-CM

## 2022-10-13 MED ORDER — PREDNISONE 10 MG (21) PO TBPK: ORAL_TABLET | ORAL | 0 refills | Status: DC

## 2022-10-13 MED ORDER — AMOXICILLIN-POT CLAVULANATE 875-125 MG PO TABS: 1.0000 | ORAL_TABLET | Freq: Two times a day (BID) | ORAL | 0 refills | Status: AC

## 2022-10-13 NOTE — Discharge Instructions (Signed)
Take over-the-counter Allegra 180 mg daily or Zyrtec or Claritin 10 mg daily to help with your itching.  You can take over-the-counter Benadryl, 50 mg at bedtime, as needed for itching and sleep.  Take the prednisone pack according to the package instructions.  You will taken on tapering dose over a period of 6 days.  Take it with food and always take it first in the morning with breakfast.  Take the Augmentin twice daily for 10 days for treatment of possible bacterial infection.  If you develop any worsening swelling of your eye, light sensitivity, or changes in vision you need to go to the ER for evaluation.

## 2022-10-13 NOTE — ED Triage Notes (Signed)
Patient reports a lot of sinus pressure and pain -- started today.

## 2022-10-13 NOTE — ED Provider Notes (Signed)
[facility]    CSN: [id number] Arrival date & time: 10/13/22  1528      History   Chief Complaint Chief Complaint  Patient presents with   sinus pressure    HPI Joe Richardson is a 51 y.o. male.   HPI  51 year old male here for evaluation of sinus pressure.  Patient reports that he developed acute onset of significant sinus pain and pressure as well as swelling to his left eye this afternoon.  He denies any fever, nasal discharge, ear pain, sore throat, or cough.  He also denies any changes in vision or trauma to his eye.  He states that yesterday he mowed the grass but he does not member getting anything in his eye.  Past Medical History:  Diagnosis Date   Allergies    Anxiety    Diabetes mellitus without complication (HCC)    pre-diabetes   GERD (gastroesophageal reflux disease)    Headache    Hyperlipidemia    Sleep apnea    uses CPAP    There are no problems to display for this patient.   Past Surgical History:  Procedure Laterality Date   lipoma removal Left    underarm   TRIGGER FINGER RELEASE     WISDOM TOOTH EXTRACTION         Home Medications    Prior to Admission medications   Medication Sig Start Date End Date Taking? Authorizing Provider  amLODipine (NORVASC) 5 MG tablet Take 5 mg by mouth daily. 09/23/22  Yes [provider]  amoxicillin-clavulanate (AUGMENTIN) 875-125 MG tablet Take 1 tablet by mouth every 12 (twelve) hours for 10 days. 10/13/22 10/23/22 Yes Becky Augusta, NP  atorvastatin (LIPITOR) 40 MG tablet Take 40 mg by mouth daily. 06/12/20  Yes [provider]  FARXIGA 10 MG TABS tablet Take 10 mg by mouth daily. 06/26/20  Yes [provider]  JANUVIA 100 MG tablet Take 100 mg by mouth daily. 04/22/20  Yes [provider]  levothyroxine (SYNTHROID) 50 MCG tablet Take 50 mcg by mouth daily. 09/27/22  Yes [provider]  metFORMIN (GLUCOPHAGE-XR) 500 MG 24 hr tablet  05/07/18  Yes  [provider]  predniSONE (STERAPRED UNI-PAK 21 TAB) 10 MG (21) TBPK tablet Take 6 tablets on day 1, 5 tablets day 2, 4 tablets day 3, 3 tablets day 4, 2 tablets day 5, 1 tablet day 6 10/13/22  Yes Becky Augusta, NP  TRADJENTA 5 MG TABS tablet Take 5 mg by mouth daily. 08/15/22  Yes [provider]  venlafaxine XR (EFFEXOR-XR) 150 MG 24 hr capsule Take 1 capsule by mouth daily. 03/17/17  Yes [provider]  albuterol (PROVENTIL HFA;VENTOLIN HFA) 108 (90 Base) MCG/ACT inhaler Inhale 1-2 puffs into the lungs every 4 (four) hours as needed for wheezing or shortness of breath. 07/24/18   Isa Rankin, MD  benzonatate (TESSALON) 100 MG capsule Take 2 capsules (200 mg total) by mouth every 8 (eight) hours. 10/12/21   Becky Augusta, NP  ipratropium (ATROVENT) 0.06 % nasal spray Place 2 sprays into both nostrils 4 (four) times daily. 10/12/21   Becky Augusta, NP  montelukast (SINGULAIR) 10 MG tablet Take 10 mg by mouth at bedtime.    [provider]  promethazine-dextromethorphan (PROMETHAZINE-DM) 6.25-15 MG/5ML syrup Take 5 mLs by mouth 4 (four) times daily as needed. 10/12/21   Becky Augusta, NP    Family History Family History  Problem Relation Age of Onset   Diabetes Mother  Asthma Mother    Diabetes Father     Social History Social History   Tobacco Use   Smoking status: Never   Smokeless tobacco: Never  Vaping Use   Vaping Use: Never used  Substance Use Topics   Alcohol use: Yes    Alcohol/week: 3.0 standard drinks of alcohol    Types: 3 Cans of beer per week    Comment: occasionally   Drug use: No     Allergies   Patient has no known allergies.   Review of Systems Review of Systems  Constitutional:  Negative for fever.  HENT:  Positive for sinus pressure and sinus pain. Negative for congestion, ear pain, rhinorrhea and sore throat.   Eyes:  Positive for pain and redness. Negative for photophobia, discharge and visual disturbance.   Respiratory:  Negative for cough.      Physical Exam Triage Vital Signs ED Triage Vitals  Enc Vitals Group     BP 10/13/22 1544 126/87     Pulse Rate 10/13/22 1544 81     Resp --      Temp 10/13/22 1544 98.4 F (36.9 C)     Temp Source 10/13/22 1544 Oral     SpO2 10/13/22 1544 93 %     Weight 10/13/22 1542 215 lb (97.5 kg)     Height 10/13/22 1542 5\' 8"  (1.727 m)     Head Circumference --      Peak Flow --      Pain Score 10/13/22 1542 7     Pain Loc --      Pain Edu? --      Excl. in GC? --    No data found.  Updated Vital Signs BP 126/87 (BP Location: Left Arm)   Pulse 81   Temp 98.4 F (36.9 C) (Oral)   Ht 5\' 8"  (1.727 m)   Wt 215 lb (97.5 kg)   SpO2 93%   BMI 32.69 kg/m   Visual Acuity Right Eye Distance:   Left Eye Distance:   Bilateral Distance:    Right Eye Near:   Left Eye Near:    Bilateral Near:     Physical Exam Vitals and nursing note reviewed.  Constitutional:      Appearance: Normal appearance. He is not ill-appearing.  HENT:     Head: Normocephalic and atraumatic.     Right Ear: Tympanic membrane, ear canal and external ear normal. There is no impacted cerumen.     Left Ear: Tympanic membrane, ear canal and external ear normal. There is no impacted cerumen.     Nose: Congestion present. No rhinorrhea.     Mouth/Throat:     Mouth: Mucous membranes are moist.     Pharynx: Oropharynx is clear. No posterior oropharyngeal erythema.  Eyes:     Extraocular Movements: Extraocular movements intact.     Conjunctiva/sclera: Conjunctivae normal.     Pupils: Pupils are equal, round, and reactive to light.  Cardiovascular:     Rate and Rhythm: Normal rate and regular rhythm.     Pulses: Normal pulses.     Heart sounds: Normal heart sounds. No murmur heard.    No friction rub. No gallop.  Pulmonary:     Effort: Pulmonary effort is normal.     Breath sounds: Normal breath sounds. No wheezing, rhonchi or rales.  Musculoskeletal:     Cervical  back: Normal range of motion and neck supple.  Lymphadenopathy:     Cervical: No cervical adenopathy.  Skin:    Capillary Refill: Capillary refill takes less than 2 seconds.  Neurological:     General: No focal deficit present.     Mental Status: He is alert and oriented to person, place, and time.  Psychiatric:        Mood and Affect: Mood normal.        Behavior: Behavior normal.        Thought Content: Thought content normal.        Judgment: Judgment normal.      UC Treatments / Results  Labs (all labs ordered are listed, but only abnormal results are displayed) Labs Reviewed - No data to display  EKG   Radiology No results found.  Procedures Procedures (including critical care time)  Medications Ordered in UC Medications - No data to display  Initial Impression / Assessment and Plan / UC Course  I have reviewed the triage vital signs and the nursing notes.  Pertinent labs & imaging results that were available during my care of the patient were reviewed by me and considered in my medical decision making (see chart for details).   Patient is a nontoxic-appearing 51 year old male here for evaluation of intense sinus pressure and pain that started abruptly this afternoon but does not in the setting of any nasal discharge or fever.  Also swelling to the left eye that started at the same time.  The eyelid on the left eye is swollen on the upper and lower portions but no erythema, induration, or fluctuance.  Patient's pupils equal round and reactive and his EOM is intact.  He has normal red light reflex in the left eye.  Bulbar and labral conjunctiva are white and quiet.  The swelling is unilateral and its abrupt onset is concerning for an allergic response but is unclear as to what stimuli.  There is no drainage appreciated on exam.  Patient does have inflamed and erythematous nasal mucosa but no rhinorrhea or discharge appreciated.  Oropharyngeal exam is benign.   Cardiopulmonary exam reveals clear lung sounds in all fields.  I will treat patient for potential allergic reaction with prednisone and over-the-counter antihistamines.  I will also cover him for possible bacterial infection, though I do not suspect preseptal cellulitis, with Augmentin 875 twice daily for 10 days.  I have advised the patient that if his swelling worsens, he develops any fever, or he develops change in vision that he needs to be reevaluated.   Final Clinical Impressions(s) / UC Diagnoses   Final diagnoses:  Nasal congestion  Eye swelling, left     Discharge Instructions      Take over-the-counter Allegra 180 mg daily or Zyrtec or Claritin 10 mg daily to help with your itching.  You can take over-the-counter Benadryl, 50 mg at bedtime, as needed for itching and sleep.  Take the prednisone pack according to the package instructions.  You will taken on tapering dose over a period of 6 days.  Take it with food and always take it first in the morning with breakfast.  Take the Augmentin twice daily for 10 days for treatment of possible bacterial infection.  If you develop any worsening swelling of your eye, light sensitivity, or changes in vision you need to go to the ER for evaluation.      ED Prescriptions     Medication Sig Dispense Auth. Provider   amoxicillin-clavulanate (AUGMENTIN) 875-125 MG tablet Take 1 tablet by mouth every 12 (twelve) hours for 10 days. 20 tablet Thurmond Butts,  Riki Rusk, NP   predniSONE (STERAPRED UNI-PAK 21 TAB) 10 MG (21) TBPK tablet Take 6 tablets on day 1, 5 tablets day 2, 4 tablets day 3, 3 tablets day 4, 2 tablets day 5, 1 tablet day 6 21 tablet Becky Augusta, NP      PDMP not reviewed this encounter.   Becky Augusta, NP 10/13/22 [phone]

## 2023-04-03 ENCOUNTER — Telehealth: Payer: Self-pay

## 2023-04-03 NOTE — Telephone Encounter (Signed)
Pt is returning call in ref to procedure please return call

## 2023-04-04 ENCOUNTER — Other Ambulatory Visit: Payer: Self-pay

## 2023-04-04 ENCOUNTER — Telehealth: Payer: Self-pay

## 2023-04-04 DIAGNOSIS — Z1211 Encounter for screening for malignant neoplasm of colon: Secondary | ICD-10-CM

## 2023-04-04 MED ORDER — NA SULFATE-K SULFATE-MG SULF 17.5-3.13-1.6 GM/177ML PO SOLN: 1.0000 | Freq: Once | ORAL | 0 refills | Status: AC

## 2023-04-04 NOTE — Telephone Encounter (Signed)
Gastroenterology Pre-Procedure Review  Request Date: 05/19/23 Requesting Physician: Dr. Allegra Lai  PATIENT REVIEW QUESTIONS: The patient responded to the following health history questions as indicated:    1. Are you having any GI issues? no 2. Do you have a personal history of Polyps? no 3. Do you have a family history of Colon Cancer or Polyps? yes (mothers cousin had colon cancer) 4. Diabetes Mellitus? yes (Farxiga stop 06/07.  Januvia,Trajenta, and Metformin stop 06/08. ) 5. Joint replacements in the past 12 months?no 6. Major health problems in the past 3 months?no 7. Any artificial heart valves, MVP, or defibrillator?no    MEDICATIONS & ALLERGIES:    Patient reports the following regarding taking any anticoagulation/antiplatelet therapy:   Plavix, Coumadin, Eliquis, Xarelto, Lovenox, Pradaxa, Brilinta, or Effient? no Aspirin? no  Patient confirms/reports the following medications:  Current Outpatient Medications  Medication Sig Dispense Refill   albuterol (PROVENTIL HFA;VENTOLIN HFA) 108 (90 Base) MCG/ACT inhaler Inhale 1-2 puffs into the lungs every 4 (four) hours as needed for wheezing or shortness of breath. 1 Inhaler 0   amLODipine (NORVASC) 5 MG tablet Take 5 mg by mouth daily.     atorvastatin (LIPITOR) 40 MG tablet Take 40 mg by mouth daily.     benzonatate (TESSALON) 100 MG capsule Take 2 capsules (200 mg total) by mouth every 8 (eight) hours. 21 capsule 0   FARXIGA 10 MG TABS tablet Take 10 mg by mouth daily.     ipratropium (ATROVENT) 0.06 % nasal spray Place 2 sprays into both nostrils 4 (four) times daily. 15 mL 12   JANUVIA 100 MG tablet Take 100 mg by mouth daily.     levothyroxine (SYNTHROID) 50 MCG tablet Take 50 mcg by mouth daily.     metFORMIN (GLUCOPHAGE-XR) 500 MG 24 hr tablet      montelukast (SINGULAIR) 10 MG tablet Take 10 mg by mouth at bedtime.     predniSONE (STERAPRED UNI-PAK 21 TAB) 10 MG (21) TBPK tablet Take 6 tablets on day 1, 5 tablets day 2, 4  tablets day 3, 3 tablets day 4, 2 tablets day 5, 1 tablet day 6 21 tablet 0   promethazine-dextromethorphan (PROMETHAZINE-DM) 6.25-15 MG/5ML syrup Take 5 mLs by mouth 4 (four) times daily as needed. 118 mL 0   TRADJENTA 5 MG TABS tablet Take 5 mg by mouth daily.     venlafaxine XR (EFFEXOR-XR) 150 MG 24 hr capsule Take 1 capsule by mouth daily.     No current facility-administered medications for this visit.    Patient confirms/reports the following allergies:  No Known Allergies  No orders of the defined types were placed in this encounter.   AUTHORIZATION INFORMATION Primary Insurance: 1D#: Group #:  Secondary Insurance: 1D#: Group #:  SCHEDULE INFORMATION: Date: 05/19/23 Time: Location: [facility]

## 2023-04-29 ENCOUNTER — Ambulatory Visit: Payer: Self-pay

## 2023-04-29 ENCOUNTER — Ambulatory Visit
Admission: EM | Admit: 2023-04-29 | Discharge: 2023-04-29 | Disposition: A | Discharge status: Home / Self Care | Payer: 59 | Attending: Emergency Medicine | Admitting: Emergency Medicine

## 2023-04-29 ENCOUNTER — Ambulatory Visit (INDEPENDENT_AMBULATORY_CARE_PROVIDER_SITE_OTHER): Payer: 59

## 2023-04-29 DIAGNOSIS — J069 Acute upper respiratory infection, unspecified: Secondary | ICD-10-CM

## 2023-04-29 NOTE — ED Provider Notes (Signed)
[facility]    CSN: [id number] Arrival date & time: 04/29/23  4098      History   Chief Complaint Chief Complaint  Patient presents with   Cough   Nasal Congestion   Generalized Body Aches    HPI Joe Richardson is a 52 y.o. male.   52 year old male patient, Joe Richardson, presents to urgent care with chief complaint of cough nasal congestion body aches x 4 to 5 days.  Patient states he was recently exposed to someone with similar illnesses.  Patient denies smoking drinking or drug use  Patient takes metformin for diabetes and glucose check usually runs around 100 per patient report.  The history is provided by the patient. No language interpreter was used.    Past Medical History:  Diagnosis Date   Allergies    Anxiety    Diabetes mellitus without complication (HCC)    pre-diabetes   GERD (gastroesophageal reflux disease)    Headache    Hyperlipidemia    Sleep apnea    uses CPAP    Patient Active Problem List   Diagnosis Date Noted   Viral URI with cough 04/29/2023    Past Surgical History:  Procedure Laterality Date   lipoma removal Left    underarm   TRIGGER FINGER RELEASE     WISDOM TOOTH EXTRACTION         Home Medications    Prior to Admission medications   Medication Sig Start Date End Date Taking? Authorizing Provider  albuterol (PROVENTIL HFA;VENTOLIN HFA) 108 (90 Base) MCG/ACT inhaler Inhale 1-2 puffs into the lungs every 4 (four) hours as needed for wheezing or shortness of breath. 07/24/18  Yes Isa Rankin, MD  amLODipine (NORVASC) 5 MG tablet Take 5 mg by mouth daily. 09/23/22  Yes [provider]  atorvastatin (LIPITOR) 40 MG tablet Take 40 mg by mouth daily. 06/12/20  Yes [provider]  FARXIGA 10 MG TABS tablet Take 10 mg by mouth daily. 06/26/20  Yes [provider]  ipratropium (ATROVENT) 0.06 % nasal spray Place 2 sprays into both nostrils 4 (four) times daily. 10/12/21  Yes Becky Augusta, NP   levothyroxine (SYNTHROID) 50 MCG tablet Take 50 mcg by mouth daily. 09/27/22  Yes [provider]  metFORMIN (GLUCOPHAGE-XR) 500 MG 24 hr tablet  05/07/18  Yes [provider]  montelukast (SINGULAIR) 10 MG tablet Take 10 mg by mouth at bedtime.   Yes [provider]  predniSONE (STERAPRED UNI-PAK 21 TAB) 10 MG (21) TBPK tablet Take 6 tablets on day 1, 5 tablets day 2, 4 tablets day 3, 3 tablets day 4, 2 tablets day 5, 1 tablet day 6 10/13/22  Yes Becky Augusta, NP  TRADJENTA 5 MG TABS tablet Take 5 mg by mouth daily. 08/15/22  Yes [provider]  venlafaxine XR (EFFEXOR-XR) 150 MG 24 hr capsule Take 1 capsule by mouth daily. 03/17/17  Yes [provider]  benzonatate (TESSALON) 100 MG capsule Take 2 capsules (200 mg total) by mouth every 8 (eight) hours. 10/12/21   Becky Augusta, NP  JANUVIA 100 MG tablet Take 100 mg by mouth daily. 04/22/20   [provider]  promethazine-dextromethorphan (PROMETHAZINE-DM) 6.25-15 MG/5ML syrup Take 5 mLs by mouth 4 (four) times daily as needed. 10/12/21   Becky Augusta, NP    Family History Family History  Problem Relation Age of Onset   Diabetes Mother    Asthma Mother    Diabetes Father  Social History Social History   Tobacco Use   Smoking status: Never   Smokeless tobacco: Never  Vaping Use   Vaping Use: Never used  Substance Use Topics   Alcohol use: Yes    Alcohol/week: 3.0 standard drinks of alcohol    Types: 3 Cans of beer per week    Comment: occasionally   Drug use: No     Allergies   Patient has no known allergies.   Review of Systems Review of Systems  HENT:  Positive for congestion.   Respiratory:  Positive for cough.   Musculoskeletal:  Positive for myalgias.  All other systems reviewed and are negative.    Physical Exam Triage Vital Signs ED Triage Vitals  Enc Vitals Group     BP 04/29/23 1002 (!) 137/92     Pulse Rate 04/29/23 1002 87     Resp --      Temp  04/29/23 1002 98.8 F (37.1 C)     Temp Source 04/29/23 1002 Oral     SpO2 04/29/23 1002 93 %     Weight 04/29/23 1001 207 lb (93.9 kg)     Height 04/29/23 1001 5\' 8"  (1.727 m)     Head Circumference --      Peak Flow --      Pain Score 04/29/23 1001 7     Pain Loc --      Pain Edu? --      Excl. in GC? --    No data found.  Updated Vital Signs BP (!) 137/92 (BP Location: Left Arm)   Pulse 95   Temp 98.8 F (37.1 C) (Oral)   Ht 5\' 8"  (1.727 m)   Wt 207 lb (93.9 kg)   SpO2 94%   BMI 31.47 kg/m   Visual Acuity Right Eye Distance:   Left Eye Distance:   Bilateral Distance:    Right Eye Near:   Left Eye Near:    Bilateral Near:     Physical Exam Vitals and nursing note reviewed.  Constitutional:      General: He is not in acute distress.    Appearance: He is well-developed and well-groomed.  HENT:     Head: Normocephalic and atraumatic.  Eyes:     Conjunctiva/sclera: Conjunctivae normal.  Cardiovascular:     Rate and Rhythm: Normal rate and regular rhythm.     Pulses: Normal pulses.     Heart sounds: Normal heart sounds. No murmur heard. Pulmonary:     Effort: Pulmonary effort is normal. No respiratory distress.     Breath sounds: Normal breath sounds and air entry.  Abdominal:     Palpations: Abdomen is soft.     Tenderness: There is no abdominal tenderness.  Musculoskeletal:        General: No swelling.     Cervical back: Neck supple.  Skin:    General: Skin is warm and dry.     Capillary Refill: Capillary refill takes less than 2 seconds.  Neurological:     General: No focal deficit present.     Mental Status: He is alert and oriented to person, place, and time.     GCS: GCS eye subscore is 4. GCS verbal subscore is 5. GCS motor subscore is 6.  Psychiatric:        Attention and Perception: Attention normal.        Mood and Affect: Mood normal.        Speech: Speech normal.  Behavior: Behavior normal. Behavior is cooperative.      UC  Treatments / Results  Labs (all labs ordered are listed, but only abnormal results are displayed) Labs Reviewed - No data to display  EKG   Radiology DG Chest 2 View  Result Date: 04/29/2023 CLINICAL DATA:  cough EXAM: CHEST - 2 VIEW COMPARISON:  Chest x-ray 01/11/2020 FINDINGS: The heart and mediastinal contours are within normal limits. No focal consolidation. No pulmonary edema. No pleural effusion. No pneumothorax. No acute osseous abnormality. IMPRESSION: No active cardiopulmonary disease. Electronically Signed   By: Tish Frederickson M.D.   On: 04/29/2023 10:35    Procedures Procedures (including critical care time)  Medications Ordered in UC Medications - No data to display  Initial Impression / Assessment and Plan / UC Course  I have reviewed the triage vital signs and the nursing notes.  Pertinent labs & imaging results that were available during my care of the patient were reviewed by me and considered in my medical decision making (see chart for details).     Ddx: Viral illness, allergies Final Clinical Impressions(s) / UC Diagnoses   Final diagnoses:  Viral URI with cough     Discharge Instructions      Your chest xray was normal. No pneumonia. Most likely you have a viral illness: no antibiotic as indicated at this time, May treat with OTC meds of choice(Nyquil,Dayquil, afrin,robitussin, etc as label directed). Make sure to drink plenty of fluids to stay hydrated(gatorade, water, popsicles,jello,etc), avoid caffeine products. Follow up with PCP. Return as needed.    ED Prescriptions   None    PDMP not reviewed this encounter.   Clancy Gourd, NP 04/29/23 1112

## 2023-04-29 NOTE — ED Triage Notes (Signed)
Pt c/o possible Flu.  Pt is having upper back/neck pain, congestion, arm pain, body chills, cough x5days

## 2023-04-29 NOTE — Discharge Instructions (Addendum)
Your chest xray was normal. No pneumonia. Most likely you have a viral illness: no antibiotic as indicated at this time, May treat with OTC meds of choice(Nyquil,Dayquil, afrin,robitussin, etc as label directed). Make sure to drink plenty of fluids to stay hydrated(gatorade, water, popsicles,jello,etc), avoid caffeine products. Follow up with PCP. Return as needed.

## 2023-05-16 ENCOUNTER — Encounter: Payer: Self-pay | Admitting: Gastroenterology

## 2023-05-19 ENCOUNTER — Ambulatory Visit: Payer: 59 | Admitting: Anesthesiology

## 2023-05-19 ENCOUNTER — Encounter
Admission: RE | Disposition: A | Discharge status: Home / Self Care | Payer: Self-pay | Source: Home / Self Care | Attending: Gastroenterology

## 2023-05-19 ENCOUNTER — Ambulatory Visit
Admission: RE | Admit: 2023-05-19 | Discharge: 2023-05-19 | Disposition: A | Discharge status: Home / Self Care | Payer: 59 | Attending: Gastroenterology | Admitting: Gastroenterology

## 2023-05-19 DIAGNOSIS — K621 Rectal polyp: Secondary | ICD-10-CM | POA: Diagnosis not present

## 2023-05-19 DIAGNOSIS — Z1211 Encounter for screening for malignant neoplasm of colon: Secondary | ICD-10-CM | POA: Insufficient documentation

## 2023-05-19 DIAGNOSIS — D128 Benign neoplasm of rectum: Secondary | ICD-10-CM | POA: Insufficient documentation

## 2023-05-19 HISTORY — PX: COLONOSCOPY WITH PROPOFOL: SHX5780

## 2023-05-19 LAB — GLUCOSE, CAPILLARY: Glucose-Capillary: 135 mg/dL — ABNORMAL HIGH (ref 70–99)

## 2023-05-19 SURGERY — COLONOSCOPY WITH PROPOFOL
Anesthesia: General

## 2023-05-19 MED ORDER — PROPOFOL 500 MG/50ML IV EMUL
INTRAVENOUS | Status: DC | PRN
  Administered 2023-05-19: 175 ug/kg/min via INTRAVENOUS
  Administered 2023-05-19: 150 ug/kg/min via INTRAVENOUS

## 2023-05-19 MED ORDER — PHENYLEPHRINE HCL (PRESSORS) 10 MG/ML IV SOLN
INTRAVENOUS | Status: DC | PRN
  Administered 2023-05-19: 160 ug via INTRAVENOUS

## 2023-05-19 MED ORDER — LIDOCAINE HCL (CARDIAC) PF 100 MG/5ML IV SOSY
PREFILLED_SYRINGE | INTRAVENOUS | Status: DC | PRN
  Administered 2023-05-19: 40 mg via INTRAVENOUS

## 2023-05-19 MED ORDER — PHENYLEPHRINE 80 MCG/ML (10ML) SYRINGE FOR IV PUSH (FOR BLOOD PRESSURE SUPPORT)
PREFILLED_SYRINGE | INTRAVENOUS | Status: AC
  Filled 2023-05-19: qty 10

## 2023-05-19 MED ORDER — SODIUM CHLORIDE 0.9 % IV SOLN
INTRAVENOUS | Status: DC
  Administered 2023-05-19: 20 mL/h via INTRAVENOUS

## 2023-05-19 MED ORDER — DEXMEDETOMIDINE HCL IN NACL 200 MCG/50ML IV SOLN
INTRAVENOUS | Status: DC | PRN
  Administered 2023-05-19: 8 ug via INTRAVENOUS

## 2023-05-19 MED ORDER — PROPOFOL 1000 MG/100ML IV EMUL
INTRAVENOUS | Status: AC
  Filled 2023-05-19: qty 100

## 2023-05-19 MED ORDER — PROPOFOL 10 MG/ML IV BOLUS
INTRAVENOUS | Status: DC | PRN
  Administered 2023-05-19: 10 mg via INTRAVENOUS
  Administered 2023-05-19: 90 mg via INTRAVENOUS

## 2023-05-19 MED ORDER — DEXMEDETOMIDINE HCL IN NACL 80 MCG/20ML IV SOLN
INTRAVENOUS | Status: AC
  Filled 2023-05-19: qty 20

## 2023-05-19 NOTE — Transfer of Care (Signed)
Immediate Anesthesia Transfer of Care Note  Patient: Joe Richardson  Procedure(s) Performed: Procedure(s): COLONOSCOPY WITH PROPOFOL (N/A)  Patient Location: PACU and Endoscopy Unit  Anesthesia Type:General  Level of Consciousness: sedated  Airway & Oxygen Therapy: Patient Spontanous Breathing and Patient connected to nasal cannula oxygen  Post-op Assessment: Report given to RN and Post -op Vital signs reviewed and stable  Post vital signs: Reviewed and stable  Last Vitals:  Vitals:   05/19/23 0811 05/19/23 0952  BP: (!) 135/90 (!) 92/56  Pulse: 68 67  Resp: 20 16  Temp: (!) 36 C (!) 35.9 C  SpO2: 99% 94%    Complications: No apparent anesthesia complications

## 2023-05-19 NOTE — Anesthesia Procedure Notes (Signed)
Date/Time: 05/19/2023 9:24 AM  Performed by: Stormy Fabian, CRNAPre-anesthesia Checklist: Patient identified, Emergency Drugs available, Suction available and Patient being monitored Patient Re-evaluated:Patient Re-evaluated prior to induction Oxygen Delivery Method: Supernova nasal CPAP Induction Type: IV induction Dental Injury: Teeth and Oropharynx as per pre-operative assessment  Comments: Nasal cannula with etCO2 monitoring

## 2023-05-19 NOTE — Op Note (Signed)
[facility] Gastroenterology Patient Name: Joe Richardson Procedure Date: 05/19/2023 9:09 AM MRN: [id number] Account #: [id number] Date of Birth: 16-Jan-1971 Admit Type: Outpatient Age: 52 Room: [facility] ENDO ROOM 4 Gender: Male Note Status: Finalized Instrument Name: Prentice Docker [id number] Procedure:             Colonoscopy Indications:           Screening for colorectal malignant neoplasm, This is                         the patient's first colonoscopy Providers:             Toney Reil MD, MD Referring MD:          No Local Md, MD (Referring MD) Medicines:             General Anesthesia Complications:         No immediate complications. Estimated blood loss: None. Procedure:             Pre-Anesthesia Assessment:                        - Prior to the procedure, a History and Physical was                         performed, and patient medications and allergies were                         reviewed. The patient is competent. The risks and                         benefits of the procedure and the sedation options and                         risks were discussed with the patient. All questions                         were answered and informed consent was obtained.                         Patient identification and proposed procedure were                         verified by the physician, the nurse, the                         anesthesiologist, the anesthetist and the technician                         in the pre-procedure area in the procedure room in the                         endoscopy suite. Mental Status Examination: alert and                         oriented. Airway Examination: normal oropharyngeal                         airway and neck mobility. Respiratory Examination:  clear to auscultation. CV Examination: normal.                         Prophylactic Antibiotics: The patient does not require                         prophylactic  antibiotics. Prior Anticoagulants: The                         patient has taken no anticoagulant or antiplatelet                         agents. ASA Grade Assessment: III - A patient with                         severe systemic disease. After reviewing the risks and                         benefits, the patient was deemed in satisfactory                         condition to undergo the procedure. The anesthesia                         plan was to use general anesthesia. Immediately prior                         to administration of medications, the patient was                         re-assessed for adequacy to receive sedatives. The                         heart rate, respiratory rate, oxygen saturations,                         blood pressure, adequacy of pulmonary ventilation, and                         response to care were monitored throughout the                         procedure. The physical status of the patient was                         re-assessed after the procedure.                        After obtaining informed consent, the colonoscope was                         passed under direct vision. Throughout the procedure,                         the patient's blood pressure, pulse, and oxygen                         saturations were monitored continuously. The  Colonoscope was introduced through the anus and                         advanced to the the cecum, identified by appendiceal                         orifice and ileocecal valve. The colonoscopy was                         performed without difficulty. The patient tolerated                         the procedure well. The quality of the bowel                         preparation was evaluated using the BBPS [facility] Bowel                         Preparation Scale) with scores of: Right Colon = 3,                         Transverse Colon = 3 and Left Colon = 3 (entire mucosa                         seen  well with no residual staining, small fragments                         of stool or opaque liquid). The total BBPS score                         equals 9. The ileocecal valve, appendiceal orifice,                         and rectum were photographed. Findings:      The perianal and digital rectal examinations were normal. Pertinent       negatives include normal sphincter tone and no palpable rectal lesions.      An 8 mm polyp was found in the distal rectum. The polyp was sessile. The       polyp was removed with a cold snare. Resection and retrieval were       complete. Estimated blood loss was minimal. To prevent bleeding after       the polypectomy, two hemostatic clips were successfully placed (MR       safe). Clip manufacturer: [employer]. There was no bleeding at       the end of the procedure.      The exam was otherwise without abnormality. Impression:            - One 8 mm polyp in the distal rectum, removed with a                         cold snare. Resected and retrieved. Clip manufacturer:                         [employer]. Clips (MR safe) were placed.                        -  The examination was otherwise normal. Recommendation:        - Discharge patient to home (with escort).                        - Resume previous diet today.                        - Continue present medications.                        - Await pathology results.                        - Repeat colonoscopy in 5 years for surveillance. Procedure Code(s):     --- Professional ---                        [phone], Colonoscopy, flexible; with removal of                         tumor(s), polyp(s), or other lesion(s) by snare                         technique Diagnosis Code(s):     --- Professional ---                        Z12.11, Encounter for screening for malignant neoplasm                         of colon                        D12.8, Benign neoplasm of rectum CPT copyright 2022 American  Medical Association. All rights reserved. The codes documented in this report are preliminary and upon coder review may  be revised to meet current compliance requirements. Dr. Libby Maw Toney Reil MD, MD 05/19/2023 9:50:54 AM This report has been signed electronically. Number of Addenda: 0 Note Initiated On: 05/19/2023 9:09 AM Scope Withdrawal Time: 0 hours 16 minutes 49 seconds  Total Procedure Duration: 0 hours 21 minutes 40 seconds  Estimated Blood Loss:  Estimated blood loss was minimal.      [facility]

## 2023-05-19 NOTE — H&P (Signed)
Joe Repress, MD [address]  [address]  [place], [place] [address]  Main: [phone]  Fax: [phone] Pager: [phone]  Primary Care Physician:  Barbette Reichmann, MD Primary Gastroenterologist:  Dr. Arlyss Richardson  Pre-Procedure History & Physical: HPI:  Joe Richardson is a 52 y.o. male is here for an colonoscopy.   Past Medical History:  Diagnosis Date   Allergies    Anxiety    Diabetes mellitus without complication (HCC)    pre-diabetes   GERD (gastroesophageal reflux disease)    Headache    Hyperlipidemia    Sleep apnea    uses CPAP    Past Surgical History:  Procedure Laterality Date   lipoma removal Left    underarm   TRIGGER FINGER RELEASE     WISDOM TOOTH EXTRACTION      Prior to Admission medications   Medication Sig Start Date End Date Taking? Authorizing Provider  albuterol (PROVENTIL HFA;VENTOLIN HFA) 108 (90 Base) MCG/ACT inhaler Inhale 1-2 puffs into the lungs every 4 (four) hours as needed for wheezing or shortness of breath. 07/24/18  Yes Isa Rankin, MD  amLODipine (NORVASC) 5 MG tablet Take 5 mg by mouth daily. 09/23/22  Yes [provider]  atorvastatin (LIPITOR) 40 MG tablet Take 40 mg by mouth daily. 06/12/20  Yes [provider]  benzonatate (TESSALON) 100 MG capsule Take 2 capsules (200 mg total) by mouth every 8 (eight) hours. 10/12/21  Yes Becky Augusta, NP  FARXIGA 10 MG TABS tablet Take 10 mg by mouth daily. 06/26/20  Yes [provider]  ipratropium (ATROVENT) 0.06 % nasal spray Place 2 sprays into both nostrils 4 (four) times daily. 10/12/21  Yes Becky Augusta, NP  JANUVIA 100 MG tablet Take 100 mg by mouth daily. 04/22/20  Yes [provider]  levothyroxine (SYNTHROID) 50 MCG tablet Take 50 mcg by mouth daily. 09/27/22  Yes [provider]  metFORMIN (GLUCOPHAGE-XR) 500 MG 24 hr tablet  05/07/18  Yes [provider]  predniSONE (STERAPRED UNI-PAK 21 TAB) 10 MG (21) TBPK tablet  Take 6 tablets on day 1, 5 tablets day 2, 4 tablets day 3, 3 tablets day 4, 2 tablets day 5, 1 tablet day 6 10/13/22  Yes Becky Augusta, NP  promethazine-dextromethorphan (PROMETHAZINE-DM) 6.25-15 MG/5ML syrup Take 5 mLs by mouth 4 (four) times daily as needed. 10/12/21  Yes Becky Augusta, NP  TRADJENTA 5 MG TABS tablet Take 5 mg by mouth daily. 08/15/22  Yes [provider]  venlafaxine XR (EFFEXOR-XR) 150 MG 24 hr capsule Take 1 capsule by mouth daily. 03/17/17  Yes [provider]  montelukast (SINGULAIR) 10 MG tablet Take 10 mg by mouth at bedtime.    [provider]    Allergies as of 04/04/2023   (No Known Allergies)    Family History  Problem Relation Age of Onset   Diabetes Mother    Asthma Mother    Diabetes Father     Social History   Socioeconomic History   Marital status: Married    Spouse name: Not on file   Number of children: Not on file   Years of education: Not on file   Highest education level: Not on file  Occupational History   Not on file  Tobacco Use   Smoking status: Never   Smokeless tobacco: Never  Vaping Use   Vaping Use: Never used  Substance and Sexual Activity   Alcohol use: Yes    Alcohol/week: 3.0  standard drinks of alcohol    Types: 3 Cans of beer per week    Comment: occasionally   Drug use: No   Sexual activity: Not on file  Other Topics Concern   Not on file  Social History Narrative   Not on file   Social Determinants of Health   Financial Resource Strain: Not on file  Food Insecurity: Not on file  Transportation Needs: Not on file  Physical Activity: Not on file  Stress: Not on file  Social Connections: Not on file  Intimate Partner Violence: Not on file    Review of Systems: See HPI, otherwise negative ROS  Physical Exam: BP (!) 135/90   Pulse 68   Temp (!) 96.8 F (36 C) (Temporal)   Resp 20   Ht 5\' 8"  (1.727 m)   Wt 94.4 kg   SpO2 99%   BMI 31.66 kg/m  General:   Alert,  pleasant and  cooperative in NAD Head:  Normocephalic and atraumatic. Neck:  Supple; no masses or thyromegaly. Lungs:  Clear throughout to auscultation.    Heart:  Regular rate and rhythm. Abdomen:  Soft, nontender and nondistended. Normal bowel sounds, without guarding, and without rebound.   Neurologic:  Alert and  oriented x4;  grossly normal neurologically.  Impression/Plan: Joe Richardson is here for an colonoscopy to be performed for colon cancer screening  Risks, benefits, limitations, and alternatives regarding  colonoscopy have been reviewed with the patient.  Questions have been answered.  All parties agreeable.   Lannette Donath, MD  05/19/2023, 8:46 AM

## 2023-05-19 NOTE — Anesthesia Preprocedure Evaluation (Signed)
Anesthesia Evaluation  Patient identified by MRN, date of birth, ID band Patient awake    Reviewed: Allergy & Precautions, NPO status , Patient's Chart, lab work & pertinent test results  History of Anesthesia Complications Negative for: history of anesthetic complications  Airway Mallampati: III  TM Distance: >3 FB Neck ROM: full    Dental no notable dental hx.    Pulmonary sleep apnea and Continuous Positive Airway Pressure Ventilation    Pulmonary exam normal        Cardiovascular negative cardio ROS Normal cardiovascular exam     Neuro/Psych negative neurological ROS  negative psych ROS   GI/Hepatic Neg liver ROS,GERD  Controlled,,  Endo/Other  negative endocrine ROSdiabetes    Renal/GU negative Renal ROS  negative genitourinary   Musculoskeletal   Abdominal   Peds  Hematology negative hematology ROS (+)   Anesthesia Other Findings Past Medical History: No date: Allergies No date: Anxiety No date: Diabetes mellitus without complication (HCC)     Comment:  pre-diabetes No date: GERD (gastroesophageal reflux disease) No date: Headache No date: Hyperlipidemia No date: Sleep apnea     Comment:  uses CPAP  Past Surgical History: No date: lipoma removal; Left     Comment:  underarm No date: TRIGGER FINGER RELEASE No date: WISDOM TOOTH EXTRACTION  BMI    Body Mass Index: 31.66 kg/m      Reproductive/Obstetrics negative OB ROS                             Anesthesia Physical Anesthesia Plan  ASA: 2  Anesthesia Plan: General   Post-op Pain Management: Minimal or no pain anticipated   Induction: Intravenous  PONV Risk Score and Plan: Propofol infusion and TIVA  Airway Management Planned: Natural Airway and Nasal Cannula  Additional Equipment:   Intra-op Plan:   Post-operative Plan:   Informed Consent: I have reviewed the patients History and Physical, chart, labs  and discussed the procedure including the risks, benefits and alternatives for the proposed anesthesia with the patient or authorized representative who has indicated his/her understanding and acceptance.     Dental Advisory Given  Plan Discussed with: Anesthesiologist, CRNA and Surgeon  Anesthesia Plan Comments: (Patient consented for risks of anesthesia including but not limited to:  - adverse reactions to medications - risk of airway placement if required - damage to eyes, teeth, lips or other oral mucosa - nerve damage due to positioning  - sore throat or hoarseness - Damage to heart, brain, nerves, lungs, other parts of body or loss of life  Patient voiced understanding.)       Anesthesia Quick Evaluation

## 2023-05-19 NOTE — Anesthesia Postprocedure Evaluation (Signed)
Anesthesia Post Note  Patient: Joe Richardson  Procedure(s) Performed: COLONOSCOPY WITH PROPOFOL  Patient location during evaluation: Endoscopy Anesthesia Type: General Level of consciousness: awake and alert Pain management: pain level controlled Vital Signs Assessment: post-procedure vital signs reviewed and stable Respiratory status: spontaneous breathing, nonlabored ventilation, respiratory function stable and patient connected to nasal cannula oxygen Cardiovascular status: blood pressure returned to baseline and stable Postop Assessment: no apparent nausea or vomiting Anesthetic complications: no   No notable events documented.   Last Vitals:  Vitals:   05/19/23 1002 05/19/23 1012  BP: 97/60 106/82  Pulse: 65 66  Resp: 17 18  Temp:    SpO2: 95% 95%    Last Pain:  Vitals:   05/19/23 1012  TempSrc:   PainSc: 0-No pain                 Louie Boston

## 2023-05-20 ENCOUNTER — Encounter: Payer: Self-pay | Admitting: Gastroenterology

## 2023-11-13 ENCOUNTER — Ambulatory Visit: Payer: 59

## 2023-11-13 ENCOUNTER — Ambulatory Visit
Admission: EM | Admit: 2023-11-13 | Discharge: 2023-11-13 | Disposition: A | Discharge status: Home / Self Care | Payer: 59 | Attending: Emergency Medicine | Admitting: Emergency Medicine

## 2023-11-13 DIAGNOSIS — J189 Pneumonia, unspecified organism: Secondary | ICD-10-CM

## 2023-11-13 MED ORDER — ALBUTEROL SULFATE HFA 108 (90 BASE) MCG/ACT IN AERS: 1.0000 | INHALATION_SPRAY | RESPIRATORY_TRACT | 0 refills | Status: AC | PRN

## 2023-11-13 MED ORDER — AZITHROMYCIN 250 MG PO TABS: 250.0000 mg | ORAL_TABLET | Freq: Every day | ORAL | 0 refills | Status: DC

## 2023-11-13 MED ORDER — AMOXICILLIN-POT CLAVULANATE 875-125 MG PO TABS: 1.0000 | ORAL_TABLET | Freq: Two times a day (BID) | ORAL | 0 refills | Status: AC

## 2023-11-13 MED ORDER — IPRATROPIUM BROMIDE 0.06 % NA SOLN: 2.0000 | Freq: Four times a day (QID) | NASAL | 12 refills | Status: DC

## 2023-11-13 MED ORDER — PROMETHAZINE-DM 6.25-15 MG/5ML PO SYRP: 5.0000 mL | ORAL_SOLUTION | Freq: Four times a day (QID) | ORAL | 0 refills | Status: DC | PRN

## 2023-11-13 MED ORDER — BENZONATATE 100 MG PO CAPS
200.0000 mg | ORAL_CAPSULE | Freq: Three times a day (TID) | ORAL | 0 refills | Status: DC
Start: 2023-11-13 — End: 2023-12-21

## 2023-11-13 NOTE — ED Triage Notes (Signed)
Pt c/o cough, chest congestion, nasal congestion, ear pain, eye pain, and facial pressure x4days  Pt states that he has had a productive cough with dark green and yellow phlegm.   Pt states that he has asthma and bronchitis.

## 2023-11-13 NOTE — ED Provider Notes (Addendum)
[facility]    CSN: [id number] Arrival date & time: 11/13/23  0810      History   Chief Complaint Chief Complaint  Patient presents with   Cough   Nasal Congestion   Facial Pain    HPI Joe Richardson is a 52 y.o. male.   HPI  52 year old male with a past medical history significant for GERD, hyperlipidemia, diabetes, and sleep apnea presents for evaluation of 4 days worth of productive cough and chest congestion.  He reports that his sputum is a dark green and yellow.  His wife was diagnosed with pneumonia yesterday.  Additionally he endorses nasal congestion, ear pain, facial pressure, shortness breath, and wheezing.  Tmax at home registered 100.3.  Past Medical History:  Diagnosis Date   Allergies    Anxiety    Diabetes mellitus without complication (HCC)    pre-diabetes   GERD (gastroesophageal reflux disease)    Headache    Hyperlipidemia    Sleep apnea    uses CPAP    Patient Active Problem List   Diagnosis Date Noted   Encounter for screening colonoscopy 05/19/2023   Rectal polyp 05/19/2023   Viral URI with cough 04/29/2023    Past Surgical History:  Procedure Laterality Date   COLONOSCOPY WITH PROPOFOL N/A 05/19/2023   Procedure: COLONOSCOPY WITH PROPOFOL;  Surgeon: Toney Reil, MD;  Location: [facility];  Service: Gastroenterology;  Laterality: N/A;   lipoma removal Left    underarm   TRIGGER FINGER RELEASE     WISDOM TOOTH EXTRACTION         Home Medications    Prior to Admission medications   Medication Sig Start Date End Date Taking? Authorizing Provider  amLODipine (NORVASC) 5 MG tablet Take 5 mg by mouth daily. 09/23/22  Yes [provider]  amoxicillin-clavulanate (AUGMENTIN) 875-125 MG tablet Take 1 tablet by mouth every 12 (twelve) hours for 7 days. 11/13/23 11/20/23 Yes Becky Augusta, NP  atorvastatin (LIPITOR) 40 MG tablet Take 40 mg by mouth daily. 06/12/20  Yes [provider]  azithromycin  (ZITHROMAX Z-PAK) 250 MG tablet Take 1 tablet (250 mg total) by mouth daily. Take 2 tablets on the first day and then 1 tablet daily thereafter for a total of 5 days of treatment. 11/13/23  Yes Becky Augusta, NP  benzonatate (TESSALON) 100 MG capsule Take 2 capsules (200 mg total) by mouth every 8 (eight) hours. 11/13/23  Yes Becky Augusta, NP  FARXIGA 10 MG TABS tablet Take 10 mg by mouth daily. 06/26/20  Yes [provider]  ipratropium (ATROVENT) 0.06 % nasal spray Place 2 sprays into both nostrils 4 (four) times daily. 11/13/23  Yes Becky Augusta, NP  levothyroxine (SYNTHROID) 50 MCG tablet Take 50 mcg by mouth daily. 09/27/22  Yes [provider]  metFORMIN (GLUCOPHAGE-XR) 500 MG 24 hr tablet  05/07/18  Yes [provider]  montelukast (SINGULAIR) 10 MG tablet Take 10 mg by mouth at bedtime.   Yes [provider]  promethazine-dextromethorphan (PROMETHAZINE-DM) 6.25-15 MG/5ML syrup Take 5 mLs by mouth 4 (four) times daily as needed. 11/13/23  Yes Becky Augusta, NP  venlafaxine XR (EFFEXOR-XR) 150 MG 24 hr capsule Take 1 capsule by mouth daily. 03/17/17  Yes [provider]  albuterol (VENTOLIN HFA) 108 (90 Base) MCG/ACT inhaler Inhale 1-2 puffs into the lungs every 4 (four) hours as needed for wheezing or shortness of breath. 11/13/23   Becky Augusta, NP    Family History Family History  Problem Relation Age of Onset   Diabetes Mother    Asthma Mother    Diabetes Father     Social History Social History   Tobacco Use   Smoking status: Never   Smokeless tobacco: Never  Vaping Use   Vaping status: Never Used  Substance Use Topics   Alcohol use: Yes    Alcohol/week: 3.0 standard drinks of alcohol    Types: 3 Cans of beer per week    Comment: occasionally   Drug use: No     Allergies   Patient has no known allergies.   Review of Systems Review of Systems  Constitutional:  Positive for fever.  HENT:  Positive for congestion, ear pain and  sinus pressure. Negative for sore throat.   Respiratory:  Positive for cough, shortness of breath and wheezing.      Physical Exam Triage Vital Signs ED Triage Vitals  Encounter Vitals Group     BP      Systolic BP Percentile      Diastolic BP Percentile      Pulse      Resp      Temp      Temp src      SpO2      Weight      Height      Head Circumference      Peak Flow      Pain Score      Pain Loc      Pain Education      Exclude from Growth Chart    No data found.  Updated Vital Signs BP 124/84 (BP Location: Left Arm)   Pulse 85   Temp 99 F (37.2 C) (Oral)   Ht 5\' 8"  (1.727 m)   Wt 212 lb (96.2 kg)   SpO2 92%   BMI 32.23 kg/m   Visual Acuity Right Eye Distance:   Left Eye Distance:   Bilateral Distance:    Right Eye Near:   Left Eye Near:    Bilateral Near:     Physical Exam Vitals and nursing note reviewed.  Constitutional:      Appearance: Normal appearance. He is not ill-appearing.  HENT:     Head: Normocephalic and atraumatic.     Right Ear: Tympanic membrane, ear canal and external ear normal. There is no impacted cerumen.     Left Ear: Tympanic membrane, ear canal and external ear normal. There is no impacted cerumen.     Nose: Congestion and rhinorrhea present.     Comments: The mucosa is edematous and erythematous with clear discharge in both nares.    Mouth/Throat:     Mouth: Mucous membranes are moist.     Pharynx: Oropharynx is clear. No oropharyngeal exudate or posterior oropharyngeal erythema.  Cardiovascular:     Rate and Rhythm: Normal rate and regular rhythm.     Pulses: Normal pulses.     Heart sounds: Normal heart sounds. No murmur heard.    No friction rub. No gallop.  Pulmonary:     Effort: Pulmonary effort is normal.     Breath sounds: Normal breath sounds. No wheezing, rhonchi or rales.  Musculoskeletal:     Cervical back: Normal range of motion and neck supple. No tenderness.  Lymphadenopathy:     Cervical: No cervical  adenopathy.  Skin:    General: Skin is warm and dry.     Capillary Refill: Capillary refill takes less than 2 seconds.     Findings:  No rash.  Neurological:     General: No focal deficit present.     Mental Status: He is alert and oriented to person, place, and time.      UC Treatments / Results  Labs (all labs ordered are listed, but only abnormal results are displayed) Labs Reviewed - No data to display  EKG   Radiology No results found.  Procedures Procedures (including critical care time)  Medications Ordered in UC Medications - No data to display  Initial Impression / Assessment and Plan / UC Course  I have reviewed the triage vital signs and the nursing notes.  Pertinent labs & imaging results that were available during my care of the patient were reviewed by me and considered in my medical decision making (see chart for details).   Patient is a nontoxic-appearing 52 year old male presenting for evaluation of 4 days with respiratory symptoms as outlined HPI above.  He is primarily concerned because his wife was diagnosed with pneumonia yesterday and he is experiencing a productive cough with intermittent fevers.  Tmax at home was 100.3.  He also has inflammation of his upper respiratory tract but his rhinorrhea on exam is clear.  He is able speak in full sentence without dyspnea or tachypnea but his room oxygen saturation is 92%.  He also has a mildly elevated temp of 99 here in clinic.  I will obtain a chest x-ray to evaluate for the presence of acute cardiopulmonary process.  Chest x-ray independently reviewed and evaluated by me.  Pression: There is a patchy haziness in the left lung base.  Radiology overread is pending. Radiology impression states there are heterogenous and linear opacities in the lingular segment of the left upper lobe which may represent a combination of pneumonitis and atelectasis.  New predominantly linear opacities overlying the right lower lung  zone which may represent atelectasis.  I will discharge patient home with a diagnosis of community-acquired pneumonia with a prescription for Augmentin 875 twice daily for 7 days and azithromycin once daily for 5 days.  Atrovent nasal spray to open nasal congestion, Tessalon Perles and Promethazine DM cough syrup for cough and congestion.  Over-the-counter Tylenol and/or ibuprofen as needed for fever or pain.  Precautions reviewed.  Work note provided.   Final Clinical Impressions(s) / UC Diagnoses   Final diagnoses:  Community acquired pneumonia of left lung, unspecified part of lung     Discharge Instructions      Take the Augmentin twice daily with food for 7 days for treatment of your pneumonia.  Take the azithromycin as directed.  You will take 2 tablets on day 1 and then 1 tablet each day after that for total of 5 days for treatment of your pneumonia.  Use the albuterol inhaler with a spacer, 2 puffs every 4-6 hours, as needed for shortness of breath or wheezing.  Use the Tessalon Perles every 8 hours during the day as needed for cough.  Taken with a small sip of water.  These may give you numbness to the base of your tongue or metallic taste in mouth, this is normal.  Use the Promethazine DM cough syrup at bedtime for cough and congestion as it would make you drowsy.  Return for reevaluation if you have any new or worsening symptoms.  Use over-the-counter Tylenol and/or ibuprofen according the package instructions as needed for fever or pain.  Follow-up with your primary care provider in 4 to 6 weeks for a repeat chest x-ray to ensure resolution  of your pneumonia.      ED Prescriptions     Medication Sig Dispense Auth. Provider   albuterol (VENTOLIN HFA) 108 (90 Base) MCG/ACT inhaler Inhale 1-2 puffs into the lungs every 4 (four) hours as needed for wheezing or shortness of breath. 18 g Becky Augusta, NP   amoxicillin-clavulanate (AUGMENTIN) 875-125 MG tablet Take 1 tablet  by mouth every 12 (twelve) hours for 7 days. 14 tablet Becky Augusta, NP   azithromycin (ZITHROMAX Z-PAK) 250 MG tablet Take 1 tablet (250 mg total) by mouth daily. Take 2 tablets on the first day and then 1 tablet daily thereafter for a total of 5 days of treatment. 6 tablet Becky Augusta, NP   benzonatate (TESSALON) 100 MG capsule Take 2 capsules (200 mg total) by mouth every 8 (eight) hours. 21 capsule Becky Augusta, NP   ipratropium (ATROVENT) 0.06 % nasal spray Place 2 sprays into both nostrils 4 (four) times daily. 15 mL Becky Augusta, NP   promethazine-dextromethorphan (PROMETHAZINE-DM) 6.25-15 MG/5ML syrup Take 5 mLs by mouth 4 (four) times daily as needed. 118 mL Becky Augusta, NP      PDMP not reviewed this encounter.   Becky Augusta, NP 11/13/23 1610    Becky Augusta, NP 11/13/23 [id number]

## 2023-11-13 NOTE — Discharge Instructions (Signed)
Take the Augmentin twice daily with food for 7 days for treatment of your pneumonia.  Take the azithromycin as directed.  You will take 2 tablets on day 1 and then 1 tablet each day after that for total of 5 days for treatment of your pneumonia.  Use the albuterol inhaler with a spacer, 2 puffs every 4-6 hours, as needed for shortness of breath or wheezing.  Use the Tessalon Perles every 8 hours during the day as needed for cough.  Taken with a small sip of water.  These may give you numbness to the base of your tongue or metallic taste in mouth, this is normal.  Use the Promethazine DM cough syrup at bedtime for cough and congestion as it would make you drowsy.  Return for reevaluation if you have any new or worsening symptoms.  Use over-the-counter Tylenol and/or ibuprofen according the package instructions as needed for fever or pain.  Follow-up with your primary care provider in 4 to 6 weeks for a repeat chest x-ray to ensure resolution of your pneumonia.

## 2023-12-21 ENCOUNTER — Ambulatory Visit
Admission: EM | Admit: 2023-12-21 | Discharge: 2023-12-21 | Disposition: A | Discharge status: Home / Self Care | Payer: 59 | Attending: Emergency Medicine | Admitting: Emergency Medicine

## 2023-12-21 DIAGNOSIS — M5010 Cervical disc disorder with radiculopathy, unspecified cervical region: Secondary | ICD-10-CM | POA: Diagnosis not present

## 2023-12-21 DIAGNOSIS — M25511 Pain in right shoulder: Secondary | ICD-10-CM | POA: Diagnosis not present

## 2023-12-21 MED ORDER — BACLOFEN 10 MG PO TABS: 10.0000 mg | ORAL_TABLET | Freq: Three times a day (TID) | ORAL | 0 refills | Status: AC

## 2023-12-21 MED ORDER — METHYLPREDNISOLONE 4 MG PO TBPK: ORAL_TABLET | ORAL | 0 refills | Status: AC

## 2023-12-21 MED ORDER — DEXAMETHASONE SODIUM PHOSPHATE 10 MG/ML IJ SOLN
10.0000 mg | Freq: Once | INTRAMUSCULAR | Status: AC
  Administered 2023-12-21: 10 mg via INTRAMUSCULAR

## 2023-12-21 NOTE — Discharge Instructions (Addendum)
 Starting tomorrow morning take the Medrol  Dosepak according to the package instructions.  This is a steroid which should decrease inflammation and pain.  It will also raise your blood sugars and monitor your blood sugar closely.  Starting tonight use the baclofen  10 mg every 8 hours to help with muscle tension and pain.  You may apply moist heat to your neck for 20 minutes at a time, 2-3 times a day, to help loosen the muscle and aid in pain relief.  Follow the neck exercises prescribed in your discharge instructions.  If your symptoms do not improve at the end of the steroid taper I recommend that you follow-up with a spine specialist.

## 2023-12-21 NOTE — ED Provider Notes (Signed)
 [facility]    CSN: [id number] Arrival date & time: 12/21/23  1051      History   Chief Complaint Chief Complaint  Patient presents with   Shoulder Pain    HPI Seab Axel is a 53 y.o. male.   HPI  53 year old male with past medical history significant for prediabetes, GERD, hyperlipidemia, sleep apnea on CPAP, and anxiety presents for evaluation of pain in his right shoulder that radiates down to his right elbow and started 6 days ago.  Initially started up at the base of his neck on the right-hand side.  He will experience intermittent numbness and tingling in his right hand as well as he feels like a decrease in his grip strength on the right.  He has taken 800 mg of ibuprofen, used IcyHot, and use Salonpas patches without any improvement of his symptoms.  Past Medical History:  Diagnosis Date   Allergies    Anxiety    Diabetes mellitus without complication (HCC)    pre-diabetes   GERD (gastroesophageal reflux disease)    Headache    Hyperlipidemia    Sleep apnea    uses CPAP    Patient Active Problem List   Diagnosis Date Noted   Encounter for screening colonoscopy 05/19/2023   Rectal polyp 05/19/2023   Viral URI with cough 04/29/2023    Past Surgical History:  Procedure Laterality Date   COLONOSCOPY WITH PROPOFOL  N/A 05/19/2023   Procedure: COLONOSCOPY WITH PROPOFOL ;  Surgeon: Unk Corinn Skiff, MD;  Location: [facility];  Service: Gastroenterology;  Laterality: N/A;   lipoma removal Left    underarm   TRIGGER FINGER RELEASE     WISDOM TOOTH EXTRACTION         Home Medications    Prior to Admission medications   Medication Sig Start Date End Date Taking? Authorizing Provider  amLODipine (NORVASC) 5 MG tablet Take 5 mg by mouth daily. 09/23/22  Yes [provider]  atorvastatin (LIPITOR) 40 MG tablet Take 40 mg by mouth daily. 06/12/20  Yes [provider]  baclofen  (LIORESAL ) 10 MG tablet Take 1 tablet (10 mg total)  by mouth 3 (three) times daily. 12/21/23  Yes Bernardino Ditch, NP  FARXIGA 10 MG TABS tablet Take 10 mg by mouth daily. 06/26/20  Yes [provider]  levothyroxine (SYNTHROID) 50 MCG tablet Take 50 mcg by mouth daily. 09/27/22  Yes [provider]  metFORMIN (GLUCOPHAGE-XR) 500 MG 24 hr tablet  05/07/18  Yes [provider]  methylPREDNISolone  (MEDROL  DOSEPAK) 4 MG TBPK tablet Take according to the package insert. 12/21/23  Yes Bernardino Ditch, NP  venlafaxine XR (EFFEXOR-XR) 150 MG 24 hr capsule Take 1 capsule by mouth daily. 03/17/17  Yes [provider]  albuterol  (VENTOLIN  HFA) 108 (90 Base) MCG/ACT inhaler Inhale 1-2 puffs into the lungs every 4 (four) hours as needed for wheezing or shortness of breath. 11/13/23   Bernardino Ditch, NP  montelukast (SINGULAIR) 10 MG tablet Take 10 mg by mouth at bedtime.    [provider]    Family History Family History  Problem Relation Age of Onset   Diabetes Mother    Asthma Mother    Diabetes Father     Social History Social History   Tobacco Use   Smoking status: Never   Smokeless tobacco: Never  Vaping Use   Vaping status: Never Used  Substance Use Topics   Alcohol use: Yes    Alcohol/week: 3.0 standard drinks of alcohol  Types: 3 Cans of beer per week    Comment: occasionally   Drug use: No     Allergies   Patient has no known allergies.   Review of Systems Review of Systems  Musculoskeletal:  Positive for arthralgias, myalgias and neck pain. Negative for neck stiffness.  Neurological:  Positive for weakness and numbness.     Physical Exam Triage Vital Signs ED Triage Vitals  Encounter Vitals Group     BP      Systolic BP Percentile      Diastolic BP Percentile      Pulse      Resp      Temp      Temp src      SpO2      Weight      Height      Head Circumference      Peak Flow      Pain Score      Pain Loc      Pain Education      Exclude from Growth Chart    No data  found.  Updated Vital Signs BP (!) 138/94 (BP Location: Left Arm)   Pulse 78   Temp 98.7 F (37.1 C) (Oral)   Resp 18   SpO2 95%   Visual Acuity Right Eye Distance:   Left Eye Distance:   Bilateral Distance:    Right Eye Near:   Left Eye Near:    Bilateral Near:     Physical Exam Vitals and nursing note reviewed.  Constitutional:      Appearance: Normal appearance.  Musculoskeletal:        General: Tenderness present. No signs of injury.  Skin:    General: Skin is warm and dry.     Capillary Refill: Capillary refill takes less than 2 seconds.     Findings: No bruising or erythema.  Neurological:     General: No focal deficit present.     Mental Status: He is alert and oriented to person, place, and time.     Sensory: No sensory deficit.     Motor: No weakness.     Deep Tendon Reflexes: Reflexes normal.      UC Treatments / Results  Labs (all labs ordered are listed, but only abnormal results are displayed) Labs Reviewed - No data to display  EKG   Radiology No results found.  Procedures Procedures (including critical care time)  Medications Ordered in UC Medications  dexamethasone  (DECADRON ) injection 10 mg (has no administration in time range)    Initial Impression / Assessment and Plan / UC Course  I have reviewed the triage vital signs and the nursing notes.  Pertinent labs & imaging results that were available during my care of the patient were reviewed by me and considered in my medical decision making (see chart for details).   Patient is a pleasant, nontoxic-appearing 53 year old male presenting for evaluation of right shoulder girdle pain that started 6 days ago as outlined HPI above.  No associated injury he just reports that started out of the blue.  Began in the base of his neck on the right-hand side.  It is associated with intermittent numbness and tingling in his hand as well as intermittent weakness.  Physical exam reveals a shoulder that  is in normal anatomical alignment.  Bilateral grip strength is 5/5 and sensation in both hands and fingers are intact.  Radial and ulnar pulses are 2+.  Patient has no tenderness with palpation  of the deltoid, tricep, or bicep.  He does have some muscle tension in the trapezius muscle on the right-hand side.  No midline spinous process tenderness or step-off.  I suspect the patient has some cervical radiculopathy that is contributing to his pain.  I will discharge him home with a nonsedating muscle relaxer and home physical therapy.  Since he has tried NSAIDs without any improvement of symptoms I will put him on a low-dose steroid pack that he can start tomorrow.  I will give an injection of Decadron  here in clinic to jumpstart him today given the late hour.  If his symptoms do not improve he should follow-up with a spine specialist.   Final Clinical Impressions(s) / UC Diagnoses   Final diagnoses:  Cervical disc disorder with radiculopathy of cervical region  Acute pain of right shoulder     Discharge Instructions      Starting tomorrow morning take the Medrol  Dosepak according to the package instructions.  This is a steroid which should decrease inflammation and pain.  It will also raise your blood sugars and monitor your blood sugar closely.  Starting tonight use the baclofen  10 mg every 8 hours to help with muscle tension and pain.  You may apply moist heat to your neck for 20 minutes at a time, 2-3 times a day, to help loosen the muscle and aid in pain relief.  Follow the neck exercises prescribed in your discharge instructions.  If your symptoms do not improve at the end of the steroid taper I recommend that you follow-up with a spine specialist.     ED Prescriptions     Medication Sig Dispense Auth. Provider   baclofen  (LIORESAL ) 10 MG tablet Take 1 tablet (10 mg total) by mouth 3 (three) times daily. 30 each Bernardino Ditch, NP   methylPREDNISolone  (MEDROL  DOSEPAK) 4 MG TBPK  tablet Take according to the package insert. 1 each Bernardino Ditch, NP      PDMP not reviewed this encounter.   Bernardino Ditch, NP 12/21/23 1155

## 2023-12-21 NOTE — ED Triage Notes (Signed)
 Sx x 6 days  Right shoulder/arm pain that goes down into his right elbow.   He's tried IBU and icy hot with no relief,.
